# Patient Record
Sex: Female | Born: 1935 | Race: White | Hispanic: No | Marital: Married | Smoking: Never smoker
Health system: Southern US, Community
[De-identification: ages and names within clinical notes are randomized; demographics above are authoritative.]

## PROBLEM LIST (undated history)

## (undated) DIAGNOSIS — M545 Low back pain, unspecified: Secondary | ICD-10-CM

## (undated) DIAGNOSIS — B019 Varicella without complication: Secondary | ICD-10-CM

## (undated) DIAGNOSIS — G629 Polyneuropathy, unspecified: Secondary | ICD-10-CM

## (undated) DIAGNOSIS — F0390 Unspecified dementia without behavioral disturbance: Secondary | ICD-10-CM

## (undated) DIAGNOSIS — R42 Dizziness and giddiness: Secondary | ICD-10-CM

## (undated) DIAGNOSIS — H2513 Age-related nuclear cataract, bilateral: Secondary | ICD-10-CM

## (undated) DIAGNOSIS — M1990 Unspecified osteoarthritis, unspecified site: Secondary | ICD-10-CM

## (undated) DIAGNOSIS — H35033 Hypertensive retinopathy, bilateral: Secondary | ICD-10-CM

## (undated) DIAGNOSIS — N390 Urinary tract infection, site not specified: Secondary | ICD-10-CM

## (undated) DIAGNOSIS — R51 Headache: Secondary | ICD-10-CM

## (undated) DIAGNOSIS — E079 Disorder of thyroid, unspecified: Secondary | ICD-10-CM

## (undated) DIAGNOSIS — J449 Chronic obstructive pulmonary disease, unspecified: Secondary | ICD-10-CM

## (undated) DIAGNOSIS — I10 Essential (primary) hypertension: Secondary | ICD-10-CM

## (undated) DIAGNOSIS — E785 Hyperlipidemia, unspecified: Secondary | ICD-10-CM

## (undated) DIAGNOSIS — H3530 Unspecified macular degeneration: Secondary | ICD-10-CM

## (undated) DIAGNOSIS — T7840XA Allergy, unspecified, initial encounter: Secondary | ICD-10-CM

## (undated) DIAGNOSIS — E559 Vitamin D deficiency, unspecified: Secondary | ICD-10-CM

## (undated) DIAGNOSIS — R519 Headache, unspecified: Secondary | ICD-10-CM

## (undated) DIAGNOSIS — N289 Disorder of kidney and ureter, unspecified: Secondary | ICD-10-CM

## (undated) DIAGNOSIS — H3581 Retinal edema: Secondary | ICD-10-CM

## (undated) DIAGNOSIS — F329 Major depressive disorder, single episode, unspecified: Secondary | ICD-10-CM

## (undated) HISTORY — DX: Polyneuropathy, unspecified: G62.9

## (undated) HISTORY — DX: Major depressive disorder, single episode, unspecified: F32.9

## (undated) HISTORY — DX: Chronic obstructive pulmonary disease, unspecified: J44.9

## (undated) HISTORY — DX: Dizziness and giddiness: R42

## (undated) HISTORY — DX: Age-related nuclear cataract, bilateral: H25.13

## (undated) HISTORY — DX: Retinal edema: H35.81

## (undated) HISTORY — DX: Unspecified dementia, unspecified severity, without behavioral disturbance, psychotic disturbance, mood disturbance, and anxiety: F03.90

## (undated) HISTORY — DX: Vitamin D deficiency, unspecified: E55.9

## (undated) HISTORY — DX: Disorder of thyroid, unspecified: E07.9

## (undated) HISTORY — DX: Headache: R51

## (undated) HISTORY — DX: Allergy, unspecified, initial encounter: T78.40XA

## (undated) HISTORY — DX: Hyperlipidemia, unspecified: E78.5

## (undated) HISTORY — DX: Low back pain, unspecified: M54.50

## (undated) HISTORY — DX: Headache, unspecified: R51.9

## (undated) HISTORY — PX: ABDOMINAL HYSTERECTOMY: SHX81

## (undated) HISTORY — DX: Hypertensive retinopathy, bilateral: H35.033

## (undated) HISTORY — DX: Urinary tract infection, site not specified: N39.0

## (undated) HISTORY — DX: Unspecified osteoarthritis, unspecified site: M19.90

## (undated) HISTORY — DX: Unspecified macular degeneration: H35.30

## (undated) HISTORY — DX: Essential (primary) hypertension: I10

## (undated) HISTORY — DX: Low back pain: M54.5

## (undated) HISTORY — DX: Varicella without complication: B01.9

## (undated) HISTORY — PX: TRIGGER FINGER RELEASE: SHX641

## (undated) HISTORY — DX: Disorder of kidney and ureter, unspecified: N28.9

---

## 2010-01-12 HISTORY — PX: COLONOSCOPY: SHX174

## 2015-05-20 LAB — CBC AND DIFFERENTIAL
HEMATOCRIT: 40 (ref 36–46)
HEMOGLOBIN: 13.4 (ref 12.0–16.0)
PLATELETS: 263 (ref 150–399)
WBC: 7.3

## 2015-05-20 LAB — BASIC METABOLIC PANEL
BUN: 15 (ref 4–21)
Creatinine: 1 (ref 0.5–1.1)
Glucose: 143
Potassium: 3.6 (ref 3.4–5.3)
Sodium: 143 (ref 137–147)

## 2015-05-20 LAB — HEPATIC FUNCTION PANEL
ALK PHOS: 28 (ref 25–125)
ALT: 98 — AB (ref 7–35)
AST: 20 (ref 13–35)
BILIRUBIN, TOTAL: 3.3

## 2015-05-20 LAB — POCT INR: INR: 1 (ref 0.9–1.1)

## 2015-05-20 LAB — PROTIME-INR: Protime: 10.3 (ref 10.0–13.8)

## 2015-08-20 LAB — BASIC METABOLIC PANEL
BUN: 11 (ref 4–21)
CREATININE: 0.9 (ref 0.5–1.1)
Glucose: 125
POTASSIUM: 4.1 (ref 3.4–5.3)
Sodium: 143 (ref 137–147)

## 2015-08-20 LAB — LIPID PANEL
Cholesterol: 191 (ref 0–200)
HDL: 72 — AB (ref 35–70)
LDL Cholesterol: 93
TRIGLYCERIDES: 129 (ref 40–160)

## 2015-08-20 LAB — CBC AND DIFFERENTIAL
HCT: 44 (ref 36–46)
HEMOGLOBIN: 14.8 (ref 12.0–16.0)
Platelets: 284 (ref 150–399)
WBC: 6.3

## 2015-08-20 LAB — TSH: TSH: 2.77 (ref 0.41–5.90)

## 2016-01-13 HISTORY — PX: SACRAL NERVE STIMULATOR PLACEMENT: SHX2367

## 2016-02-05 DIAGNOSIS — E039 Hypothyroidism, unspecified: Secondary | ICD-10-CM | POA: Diagnosis not present

## 2016-02-05 DIAGNOSIS — F0390 Unspecified dementia without behavioral disturbance: Secondary | ICD-10-CM | POA: Diagnosis not present

## 2016-02-05 DIAGNOSIS — E784 Other hyperlipidemia: Secondary | ICD-10-CM | POA: Diagnosis not present

## 2016-02-05 DIAGNOSIS — M150 Primary generalized (osteo)arthritis: Secondary | ICD-10-CM | POA: Diagnosis not present

## 2016-02-05 DIAGNOSIS — I10 Essential (primary) hypertension: Secondary | ICD-10-CM | POA: Diagnosis not present

## 2016-02-05 DIAGNOSIS — E785 Hyperlipidemia, unspecified: Secondary | ICD-10-CM | POA: Diagnosis not present

## 2016-02-05 DIAGNOSIS — D510 Vitamin B12 deficiency anemia due to intrinsic factor deficiency: Secondary | ICD-10-CM | POA: Diagnosis not present

## 2016-02-29 DIAGNOSIS — J069 Acute upper respiratory infection, unspecified: Secondary | ICD-10-CM | POA: Diagnosis not present

## 2016-03-26 DIAGNOSIS — G629 Polyneuropathy, unspecified: Secondary | ICD-10-CM | POA: Diagnosis not present

## 2016-03-26 DIAGNOSIS — M1990 Unspecified osteoarthritis, unspecified site: Secondary | ICD-10-CM | POA: Diagnosis not present

## 2016-03-26 DIAGNOSIS — I10 Essential (primary) hypertension: Secondary | ICD-10-CM | POA: Diagnosis not present

## 2016-03-26 DIAGNOSIS — G9009 Other idiopathic peripheral autonomic neuropathy: Secondary | ICD-10-CM | POA: Diagnosis not present

## 2016-03-26 DIAGNOSIS — Z79899 Other long term (current) drug therapy: Secondary | ICD-10-CM | POA: Diagnosis not present

## 2016-03-26 DIAGNOSIS — F0390 Unspecified dementia without behavioral disturbance: Secondary | ICD-10-CM | POA: Diagnosis not present

## 2016-03-26 DIAGNOSIS — Z961 Presence of intraocular lens: Secondary | ICD-10-CM | POA: Diagnosis not present

## 2016-03-26 DIAGNOSIS — R42 Dizziness and giddiness: Secondary | ICD-10-CM | POA: Diagnosis not present

## 2016-03-26 DIAGNOSIS — Z90710 Acquired absence of both cervix and uterus: Secondary | ICD-10-CM | POA: Diagnosis not present

## 2016-03-26 DIAGNOSIS — Z9849 Cataract extraction status, unspecified eye: Secondary | ICD-10-CM | POA: Diagnosis not present

## 2016-03-26 DIAGNOSIS — Z8719 Personal history of other diseases of the digestive system: Secondary | ICD-10-CM | POA: Diagnosis not present

## 2016-03-26 DIAGNOSIS — M549 Dorsalgia, unspecified: Secondary | ICD-10-CM | POA: Diagnosis not present

## 2016-03-26 DIAGNOSIS — Z01818 Encounter for other preprocedural examination: Secondary | ICD-10-CM | POA: Diagnosis not present

## 2016-03-26 DIAGNOSIS — Z9889 Other specified postprocedural states: Secondary | ICD-10-CM | POA: Diagnosis not present

## 2016-03-26 DIAGNOSIS — Z7982 Long term (current) use of aspirin: Secondary | ICD-10-CM | POA: Diagnosis not present

## 2016-03-26 DIAGNOSIS — E785 Hyperlipidemia, unspecified: Secondary | ICD-10-CM | POA: Diagnosis not present

## 2016-04-02 DIAGNOSIS — G9009 Other idiopathic peripheral autonomic neuropathy: Secondary | ICD-10-CM | POA: Diagnosis not present

## 2016-04-02 DIAGNOSIS — G894 Chronic pain syndrome: Secondary | ICD-10-CM | POA: Diagnosis not present

## 2016-04-07 DIAGNOSIS — M546 Pain in thoracic spine: Secondary | ICD-10-CM | POA: Diagnosis not present

## 2016-04-07 DIAGNOSIS — M545 Low back pain: Secondary | ICD-10-CM | POA: Diagnosis not present

## 2016-04-07 DIAGNOSIS — M25512 Pain in left shoulder: Secondary | ICD-10-CM | POA: Diagnosis not present

## 2016-04-07 DIAGNOSIS — M79602 Pain in left arm: Secondary | ICD-10-CM | POA: Diagnosis not present

## 2016-04-07 DIAGNOSIS — M1990 Unspecified osteoarthritis, unspecified site: Secondary | ICD-10-CM | POA: Diagnosis not present

## 2016-04-07 DIAGNOSIS — M25561 Pain in right knee: Secondary | ICD-10-CM | POA: Diagnosis not present

## 2016-04-07 DIAGNOSIS — M79671 Pain in right foot: Secondary | ICD-10-CM | POA: Diagnosis not present

## 2016-04-07 DIAGNOSIS — M5030 Other cervical disc degeneration, unspecified cervical region: Secondary | ICD-10-CM | POA: Diagnosis not present

## 2016-04-07 DIAGNOSIS — F0390 Unspecified dementia without behavioral disturbance: Secondary | ICD-10-CM | POA: Diagnosis not present

## 2016-04-07 DIAGNOSIS — M79672 Pain in left foot: Secondary | ICD-10-CM | POA: Diagnosis not present

## 2016-04-07 DIAGNOSIS — M25562 Pain in left knee: Secondary | ICD-10-CM | POA: Diagnosis not present

## 2016-04-07 DIAGNOSIS — M4692 Unspecified inflammatory spondylopathy, cervical region: Secondary | ICD-10-CM | POA: Diagnosis not present

## 2016-04-07 DIAGNOSIS — M4802 Spinal stenosis, cervical region: Secondary | ICD-10-CM | POA: Diagnosis not present

## 2016-04-07 DIAGNOSIS — M5412 Radiculopathy, cervical region: Secondary | ICD-10-CM | POA: Diagnosis not present

## 2016-04-07 DIAGNOSIS — N289 Disorder of kidney and ureter, unspecified: Secondary | ICD-10-CM | POA: Diagnosis not present

## 2016-04-07 DIAGNOSIS — I10 Essential (primary) hypertension: Secondary | ICD-10-CM | POA: Diagnosis not present

## 2016-04-14 DIAGNOSIS — M7072 Other bursitis of hip, left hip: Secondary | ICD-10-CM | POA: Diagnosis not present

## 2016-04-14 DIAGNOSIS — M47812 Spondylosis without myelopathy or radiculopathy, cervical region: Secondary | ICD-10-CM | POA: Diagnosis not present

## 2016-04-14 DIAGNOSIS — I10 Essential (primary) hypertension: Secondary | ICD-10-CM | POA: Diagnosis not present

## 2016-04-14 DIAGNOSIS — F0390 Unspecified dementia without behavioral disturbance: Secondary | ICD-10-CM | POA: Diagnosis not present

## 2016-04-14 DIAGNOSIS — M5412 Radiculopathy, cervical region: Secondary | ICD-10-CM | POA: Diagnosis not present

## 2016-04-14 DIAGNOSIS — M79602 Pain in left arm: Secondary | ICD-10-CM | POA: Diagnosis not present

## 2016-04-14 DIAGNOSIS — M792 Neuralgia and neuritis, unspecified: Secondary | ICD-10-CM | POA: Diagnosis not present

## 2016-04-14 DIAGNOSIS — M25562 Pain in left knee: Secondary | ICD-10-CM | POA: Diagnosis not present

## 2016-04-14 DIAGNOSIS — M7071 Other bursitis of hip, right hip: Secondary | ICD-10-CM | POA: Diagnosis not present

## 2016-04-14 DIAGNOSIS — M79672 Pain in left foot: Secondary | ICD-10-CM | POA: Diagnosis not present

## 2016-04-14 DIAGNOSIS — Z969 Presence of functional implant, unspecified: Secondary | ICD-10-CM | POA: Diagnosis not present

## 2016-04-14 DIAGNOSIS — M79605 Pain in left leg: Secondary | ICD-10-CM | POA: Diagnosis not present

## 2016-04-14 DIAGNOSIS — M546 Pain in thoracic spine: Secondary | ICD-10-CM | POA: Diagnosis not present

## 2016-04-14 DIAGNOSIS — M4802 Spinal stenosis, cervical region: Secondary | ICD-10-CM | POA: Diagnosis not present

## 2016-04-14 DIAGNOSIS — M545 Low back pain: Secondary | ICD-10-CM | POA: Diagnosis not present

## 2016-04-14 DIAGNOSIS — N289 Disorder of kidney and ureter, unspecified: Secondary | ICD-10-CM | POA: Diagnosis not present

## 2016-04-14 DIAGNOSIS — M461 Sacroiliitis, not elsewhere classified: Secondary | ICD-10-CM | POA: Diagnosis not present

## 2016-04-14 DIAGNOSIS — M5030 Other cervical disc degeneration, unspecified cervical region: Secondary | ICD-10-CM | POA: Diagnosis not present

## 2016-04-14 DIAGNOSIS — M791 Myalgia: Secondary | ICD-10-CM | POA: Diagnosis not present

## 2016-04-14 DIAGNOSIS — M79604 Pain in right leg: Secondary | ICD-10-CM | POA: Diagnosis not present

## 2016-04-14 DIAGNOSIS — M4692 Unspecified inflammatory spondylopathy, cervical region: Secondary | ICD-10-CM | POA: Diagnosis not present

## 2016-04-14 DIAGNOSIS — M1990 Unspecified osteoarthritis, unspecified site: Secondary | ICD-10-CM | POA: Diagnosis not present

## 2016-04-14 DIAGNOSIS — M25512 Pain in left shoulder: Secondary | ICD-10-CM | POA: Diagnosis not present

## 2016-04-14 DIAGNOSIS — M25561 Pain in right knee: Secondary | ICD-10-CM | POA: Diagnosis not present

## 2016-04-14 DIAGNOSIS — M79671 Pain in right foot: Secondary | ICD-10-CM | POA: Diagnosis not present

## 2016-04-28 DIAGNOSIS — M4802 Spinal stenosis, cervical region: Secondary | ICD-10-CM | POA: Diagnosis not present

## 2016-04-28 DIAGNOSIS — G589 Mononeuropathy, unspecified: Secondary | ICD-10-CM | POA: Diagnosis not present

## 2016-04-28 DIAGNOSIS — M25562 Pain in left knee: Secondary | ICD-10-CM | POA: Diagnosis not present

## 2016-04-28 DIAGNOSIS — M79602 Pain in left arm: Secondary | ICD-10-CM | POA: Diagnosis not present

## 2016-04-28 DIAGNOSIS — M5412 Radiculopathy, cervical region: Secondary | ICD-10-CM | POA: Diagnosis not present

## 2016-04-28 DIAGNOSIS — M545 Low back pain: Secondary | ICD-10-CM | POA: Diagnosis not present

## 2016-04-28 DIAGNOSIS — M79672 Pain in left foot: Secondary | ICD-10-CM | POA: Diagnosis not present

## 2016-04-28 DIAGNOSIS — M79604 Pain in right leg: Secondary | ICD-10-CM | POA: Diagnosis not present

## 2016-04-28 DIAGNOSIS — M791 Myalgia: Secondary | ICD-10-CM | POA: Diagnosis not present

## 2016-04-28 DIAGNOSIS — M79671 Pain in right foot: Secondary | ICD-10-CM | POA: Diagnosis not present

## 2016-04-28 DIAGNOSIS — M461 Sacroiliitis, not elsewhere classified: Secondary | ICD-10-CM | POA: Diagnosis not present

## 2016-04-28 DIAGNOSIS — N289 Disorder of kidney and ureter, unspecified: Secondary | ICD-10-CM | POA: Diagnosis not present

## 2016-04-28 DIAGNOSIS — I10 Essential (primary) hypertension: Secondary | ICD-10-CM | POA: Diagnosis not present

## 2016-04-28 DIAGNOSIS — M25561 Pain in right knee: Secondary | ICD-10-CM | POA: Diagnosis not present

## 2016-04-28 DIAGNOSIS — M25512 Pain in left shoulder: Secondary | ICD-10-CM | POA: Diagnosis not present

## 2016-04-28 DIAGNOSIS — Z79899 Other long term (current) drug therapy: Secondary | ICD-10-CM | POA: Diagnosis not present

## 2016-04-28 DIAGNOSIS — F0390 Unspecified dementia without behavioral disturbance: Secondary | ICD-10-CM | POA: Diagnosis not present

## 2016-04-28 DIAGNOSIS — Z7982 Long term (current) use of aspirin: Secondary | ICD-10-CM | POA: Diagnosis not present

## 2016-04-28 DIAGNOSIS — M7072 Other bursitis of hip, left hip: Secondary | ICD-10-CM | POA: Diagnosis not present

## 2016-04-28 DIAGNOSIS — M5030 Other cervical disc degeneration, unspecified cervical region: Secondary | ICD-10-CM | POA: Diagnosis not present

## 2016-04-28 DIAGNOSIS — M7071 Other bursitis of hip, right hip: Secondary | ICD-10-CM | POA: Diagnosis not present

## 2016-04-28 DIAGNOSIS — M546 Pain in thoracic spine: Secondary | ICD-10-CM | POA: Diagnosis not present

## 2016-04-28 DIAGNOSIS — M542 Cervicalgia: Secondary | ICD-10-CM | POA: Diagnosis not present

## 2016-04-28 DIAGNOSIS — M79605 Pain in left leg: Secondary | ICD-10-CM | POA: Diagnosis not present

## 2016-04-28 DIAGNOSIS — M129 Arthropathy, unspecified: Secondary | ICD-10-CM | POA: Diagnosis not present

## 2016-04-28 DIAGNOSIS — M47812 Spondylosis without myelopathy or radiculopathy, cervical region: Secondary | ICD-10-CM | POA: Diagnosis not present

## 2016-04-30 DIAGNOSIS — G894 Chronic pain syndrome: Secondary | ICD-10-CM | POA: Diagnosis not present

## 2016-04-30 DIAGNOSIS — T8131XA Disruption of external operation (surgical) wound, not elsewhere classified, initial encounter: Secondary | ICD-10-CM | POA: Diagnosis not present

## 2016-04-30 DIAGNOSIS — G9009 Other idiopathic peripheral autonomic neuropathy: Secondary | ICD-10-CM | POA: Diagnosis not present

## 2016-04-30 DIAGNOSIS — Z969 Presence of functional implant, unspecified: Secondary | ICD-10-CM | POA: Diagnosis not present

## 2016-04-30 DIAGNOSIS — T819XXA Unspecified complication of procedure, initial encounter: Secondary | ICD-10-CM | POA: Diagnosis not present

## 2016-05-05 DIAGNOSIS — M545 Low back pain: Secondary | ICD-10-CM | POA: Diagnosis not present

## 2016-05-05 DIAGNOSIS — M79604 Pain in right leg: Secondary | ICD-10-CM | POA: Diagnosis not present

## 2016-05-05 DIAGNOSIS — Z79899 Other long term (current) drug therapy: Secondary | ICD-10-CM | POA: Diagnosis not present

## 2016-05-05 DIAGNOSIS — M150 Primary generalized (osteo)arthritis: Secondary | ICD-10-CM | POA: Diagnosis not present

## 2016-05-05 DIAGNOSIS — M7072 Other bursitis of hip, left hip: Secondary | ICD-10-CM | POA: Diagnosis not present

## 2016-05-05 DIAGNOSIS — M7071 Other bursitis of hip, right hip: Secondary | ICD-10-CM | POA: Diagnosis not present

## 2016-05-05 DIAGNOSIS — M791 Myalgia: Secondary | ICD-10-CM | POA: Diagnosis not present

## 2016-05-05 DIAGNOSIS — M79672 Pain in left foot: Secondary | ICD-10-CM | POA: Diagnosis not present

## 2016-05-05 DIAGNOSIS — G9009 Other idiopathic peripheral autonomic neuropathy: Secondary | ICD-10-CM | POA: Diagnosis not present

## 2016-05-05 DIAGNOSIS — M25562 Pain in left knee: Secondary | ICD-10-CM | POA: Diagnosis not present

## 2016-05-05 DIAGNOSIS — M79605 Pain in left leg: Secondary | ICD-10-CM | POA: Diagnosis not present

## 2016-05-05 DIAGNOSIS — Z7982 Long term (current) use of aspirin: Secondary | ICD-10-CM | POA: Diagnosis not present

## 2016-05-05 DIAGNOSIS — M5030 Other cervical disc degeneration, unspecified cervical region: Secondary | ICD-10-CM | POA: Diagnosis not present

## 2016-05-05 DIAGNOSIS — E784 Other hyperlipidemia: Secondary | ICD-10-CM | POA: Diagnosis not present

## 2016-05-05 DIAGNOSIS — M25561 Pain in right knee: Secondary | ICD-10-CM | POA: Diagnosis not present

## 2016-05-05 DIAGNOSIS — M129 Arthropathy, unspecified: Secondary | ICD-10-CM | POA: Diagnosis not present

## 2016-05-05 DIAGNOSIS — F0390 Unspecified dementia without behavioral disturbance: Secondary | ICD-10-CM | POA: Diagnosis not present

## 2016-05-05 DIAGNOSIS — M79671 Pain in right foot: Secondary | ICD-10-CM | POA: Diagnosis not present

## 2016-05-05 DIAGNOSIS — I10 Essential (primary) hypertension: Secondary | ICD-10-CM | POA: Diagnosis not present

## 2016-05-05 DIAGNOSIS — G309 Alzheimer's disease, unspecified: Secondary | ICD-10-CM | POA: Diagnosis not present

## 2016-05-05 DIAGNOSIS — M47892 Other spondylosis, cervical region: Secondary | ICD-10-CM | POA: Diagnosis not present

## 2016-05-05 DIAGNOSIS — I129 Hypertensive chronic kidney disease with stage 1 through stage 4 chronic kidney disease, or unspecified chronic kidney disease: Secondary | ICD-10-CM | POA: Diagnosis not present

## 2016-05-05 DIAGNOSIS — M546 Pain in thoracic spine: Secondary | ICD-10-CM | POA: Diagnosis not present

## 2016-05-05 DIAGNOSIS — M4802 Spinal stenosis, cervical region: Secondary | ICD-10-CM | POA: Diagnosis not present

## 2016-05-05 DIAGNOSIS — G589 Mononeuropathy, unspecified: Secondary | ICD-10-CM | POA: Diagnosis not present

## 2016-05-05 DIAGNOSIS — M542 Cervicalgia: Secondary | ICD-10-CM | POA: Diagnosis not present

## 2016-05-05 DIAGNOSIS — M461 Sacroiliitis, not elsewhere classified: Secondary | ICD-10-CM | POA: Diagnosis not present

## 2016-05-05 DIAGNOSIS — N189 Chronic kidney disease, unspecified: Secondary | ICD-10-CM | POA: Diagnosis not present

## 2016-05-05 DIAGNOSIS — M25512 Pain in left shoulder: Secondary | ICD-10-CM | POA: Diagnosis not present

## 2016-05-05 DIAGNOSIS — M79602 Pain in left arm: Secondary | ICD-10-CM | POA: Diagnosis not present

## 2016-05-06 DIAGNOSIS — H353132 Nonexudative age-related macular degeneration, bilateral, intermediate dry stage: Secondary | ICD-10-CM | POA: Diagnosis not present

## 2016-06-03 DIAGNOSIS — G309 Alzheimer's disease, unspecified: Secondary | ICD-10-CM | POA: Diagnosis not present

## 2016-06-03 DIAGNOSIS — M79672 Pain in left foot: Secondary | ICD-10-CM | POA: Diagnosis not present

## 2016-06-03 DIAGNOSIS — M79671 Pain in right foot: Secondary | ICD-10-CM | POA: Diagnosis not present

## 2016-06-03 DIAGNOSIS — I70213 Atherosclerosis of native arteries of extremities with intermittent claudication, bilateral legs: Secondary | ICD-10-CM | POA: Diagnosis not present

## 2016-06-03 DIAGNOSIS — B351 Tinea unguium: Secondary | ICD-10-CM | POA: Diagnosis not present

## 2016-06-16 DIAGNOSIS — I10 Essential (primary) hypertension: Secondary | ICD-10-CM | POA: Diagnosis not present

## 2016-06-16 DIAGNOSIS — M4802 Spinal stenosis, cervical region: Secondary | ICD-10-CM | POA: Diagnosis not present

## 2016-06-16 DIAGNOSIS — M546 Pain in thoracic spine: Secondary | ICD-10-CM | POA: Diagnosis not present

## 2016-06-16 DIAGNOSIS — M47812 Spondylosis without myelopathy or radiculopathy, cervical region: Secondary | ICD-10-CM | POA: Diagnosis not present

## 2016-06-16 DIAGNOSIS — M5412 Radiculopathy, cervical region: Secondary | ICD-10-CM | POA: Diagnosis not present

## 2016-06-16 DIAGNOSIS — M791 Myalgia: Secondary | ICD-10-CM | POA: Diagnosis not present

## 2016-06-16 DIAGNOSIS — M79602 Pain in left arm: Secondary | ICD-10-CM | POA: Diagnosis not present

## 2016-06-16 DIAGNOSIS — M1990 Unspecified osteoarthritis, unspecified site: Secondary | ICD-10-CM | POA: Diagnosis not present

## 2016-06-16 DIAGNOSIS — M25561 Pain in right knee: Secondary | ICD-10-CM | POA: Diagnosis not present

## 2016-06-16 DIAGNOSIS — M461 Sacroiliitis, not elsewhere classified: Secondary | ICD-10-CM | POA: Diagnosis not present

## 2016-06-16 DIAGNOSIS — M79671 Pain in right foot: Secondary | ICD-10-CM | POA: Diagnosis not present

## 2016-06-16 DIAGNOSIS — N289 Disorder of kidney and ureter, unspecified: Secondary | ICD-10-CM | POA: Diagnosis not present

## 2016-06-16 DIAGNOSIS — M545 Low back pain: Secondary | ICD-10-CM | POA: Diagnosis not present

## 2016-06-16 DIAGNOSIS — M25562 Pain in left knee: Secondary | ICD-10-CM | POA: Diagnosis not present

## 2016-06-16 DIAGNOSIS — M7071 Other bursitis of hip, right hip: Secondary | ICD-10-CM | POA: Diagnosis not present

## 2016-06-16 DIAGNOSIS — M4692 Unspecified inflammatory spondylopathy, cervical region: Secondary | ICD-10-CM | POA: Diagnosis not present

## 2016-06-16 DIAGNOSIS — M25512 Pain in left shoulder: Secondary | ICD-10-CM | POA: Diagnosis not present

## 2016-06-16 DIAGNOSIS — M79672 Pain in left foot: Secondary | ICD-10-CM | POA: Diagnosis not present

## 2016-06-16 DIAGNOSIS — M5030 Other cervical disc degeneration, unspecified cervical region: Secondary | ICD-10-CM | POA: Diagnosis not present

## 2016-06-16 DIAGNOSIS — M7072 Other bursitis of hip, left hip: Secondary | ICD-10-CM | POA: Diagnosis not present

## 2016-06-16 DIAGNOSIS — F0390 Unspecified dementia without behavioral disturbance: Secondary | ICD-10-CM | POA: Diagnosis not present

## 2016-08-04 DIAGNOSIS — G308 Other Alzheimer's disease: Secondary | ICD-10-CM | POA: Diagnosis not present

## 2016-08-04 DIAGNOSIS — E784 Other hyperlipidemia: Secondary | ICD-10-CM | POA: Diagnosis not present

## 2016-08-04 DIAGNOSIS — Z8673 Personal history of transient ischemic attack (TIA), and cerebral infarction without residual deficits: Secondary | ICD-10-CM | POA: Diagnosis not present

## 2016-08-04 DIAGNOSIS — I10 Essential (primary) hypertension: Secondary | ICD-10-CM | POA: Diagnosis not present

## 2016-08-12 DIAGNOSIS — E784 Other hyperlipidemia: Secondary | ICD-10-CM | POA: Diagnosis not present

## 2016-08-12 DIAGNOSIS — I10 Essential (primary) hypertension: Secondary | ICD-10-CM | POA: Diagnosis not present

## 2016-08-24 DIAGNOSIS — B351 Tinea unguium: Secondary | ICD-10-CM | POA: Diagnosis not present

## 2016-08-24 DIAGNOSIS — M79672 Pain in left foot: Secondary | ICD-10-CM | POA: Diagnosis not present

## 2016-08-24 DIAGNOSIS — M79671 Pain in right foot: Secondary | ICD-10-CM | POA: Diagnosis not present

## 2016-08-24 DIAGNOSIS — G309 Alzheimer's disease, unspecified: Secondary | ICD-10-CM | POA: Diagnosis not present

## 2016-08-24 DIAGNOSIS — I70213 Atherosclerosis of native arteries of extremities with intermittent claudication, bilateral legs: Secondary | ICD-10-CM | POA: Diagnosis not present

## 2016-10-01 DIAGNOSIS — M79672 Pain in left foot: Secondary | ICD-10-CM | POA: Diagnosis not present

## 2016-10-01 DIAGNOSIS — F0390 Unspecified dementia without behavioral disturbance: Secondary | ICD-10-CM | POA: Diagnosis not present

## 2016-10-01 DIAGNOSIS — M5030 Other cervical disc degeneration, unspecified cervical region: Secondary | ICD-10-CM | POA: Diagnosis not present

## 2016-10-01 DIAGNOSIS — Z969 Presence of functional implant, unspecified: Secondary | ICD-10-CM | POA: Diagnosis not present

## 2016-10-01 DIAGNOSIS — I10 Essential (primary) hypertension: Secondary | ICD-10-CM | POA: Diagnosis not present

## 2016-10-01 DIAGNOSIS — M79604 Pain in right leg: Secondary | ICD-10-CM | POA: Diagnosis not present

## 2016-10-01 DIAGNOSIS — N289 Disorder of kidney and ureter, unspecified: Secondary | ICD-10-CM | POA: Diagnosis not present

## 2016-10-01 DIAGNOSIS — M461 Sacroiliitis, not elsewhere classified: Secondary | ICD-10-CM | POA: Diagnosis not present

## 2016-10-01 DIAGNOSIS — M545 Low back pain: Secondary | ICD-10-CM | POA: Diagnosis not present

## 2016-10-01 DIAGNOSIS — M791 Myalgia: Secondary | ICD-10-CM | POA: Diagnosis not present

## 2016-10-01 DIAGNOSIS — M25561 Pain in right knee: Secondary | ICD-10-CM | POA: Diagnosis not present

## 2016-10-01 DIAGNOSIS — M25562 Pain in left knee: Secondary | ICD-10-CM | POA: Diagnosis not present

## 2016-10-01 DIAGNOSIS — M5412 Radiculopathy, cervical region: Secondary | ICD-10-CM | POA: Diagnosis not present

## 2016-10-01 DIAGNOSIS — M792 Neuralgia and neuritis, unspecified: Secondary | ICD-10-CM | POA: Diagnosis not present

## 2016-10-01 DIAGNOSIS — M47812 Spondylosis without myelopathy or radiculopathy, cervical region: Secondary | ICD-10-CM | POA: Diagnosis not present

## 2016-10-01 DIAGNOSIS — M4692 Unspecified inflammatory spondylopathy, cervical region: Secondary | ICD-10-CM | POA: Diagnosis not present

## 2016-10-01 DIAGNOSIS — M546 Pain in thoracic spine: Secondary | ICD-10-CM | POA: Diagnosis not present

## 2016-10-01 DIAGNOSIS — M79605 Pain in left leg: Secondary | ICD-10-CM | POA: Diagnosis not present

## 2016-10-01 DIAGNOSIS — M4802 Spinal stenosis, cervical region: Secondary | ICD-10-CM | POA: Diagnosis not present

## 2016-10-01 DIAGNOSIS — M7072 Other bursitis of hip, left hip: Secondary | ICD-10-CM | POA: Diagnosis not present

## 2016-10-01 DIAGNOSIS — M79671 Pain in right foot: Secondary | ICD-10-CM | POA: Diagnosis not present

## 2016-10-01 DIAGNOSIS — M7071 Other bursitis of hip, right hip: Secondary | ICD-10-CM | POA: Diagnosis not present

## 2016-10-27 DIAGNOSIS — M4802 Spinal stenosis, cervical region: Secondary | ICD-10-CM | POA: Diagnosis not present

## 2016-10-27 DIAGNOSIS — M25561 Pain in right knee: Secondary | ICD-10-CM | POA: Diagnosis not present

## 2016-10-27 DIAGNOSIS — F0390 Unspecified dementia without behavioral disturbance: Secondary | ICD-10-CM | POA: Diagnosis not present

## 2016-10-27 DIAGNOSIS — M25562 Pain in left knee: Secondary | ICD-10-CM | POA: Diagnosis not present

## 2016-10-27 DIAGNOSIS — M79672 Pain in left foot: Secondary | ICD-10-CM | POA: Diagnosis not present

## 2016-10-27 DIAGNOSIS — N289 Disorder of kidney and ureter, unspecified: Secondary | ICD-10-CM | POA: Diagnosis not present

## 2016-10-27 DIAGNOSIS — M542 Cervicalgia: Secondary | ICD-10-CM | POA: Diagnosis not present

## 2016-10-27 DIAGNOSIS — Z23 Encounter for immunization: Secondary | ICD-10-CM | POA: Diagnosis not present

## 2016-10-27 DIAGNOSIS — M79605 Pain in left leg: Secondary | ICD-10-CM | POA: Diagnosis not present

## 2016-10-27 DIAGNOSIS — M461 Sacroiliitis, not elsewhere classified: Secondary | ICD-10-CM | POA: Diagnosis not present

## 2016-10-27 DIAGNOSIS — M79671 Pain in right foot: Secondary | ICD-10-CM | POA: Diagnosis not present

## 2016-10-27 DIAGNOSIS — M25512 Pain in left shoulder: Secondary | ICD-10-CM | POA: Diagnosis not present

## 2016-10-27 DIAGNOSIS — G894 Chronic pain syndrome: Secondary | ICD-10-CM | POA: Diagnosis not present

## 2016-10-27 DIAGNOSIS — M79604 Pain in right leg: Secondary | ICD-10-CM | POA: Diagnosis not present

## 2016-10-27 DIAGNOSIS — M79602 Pain in left arm: Secondary | ICD-10-CM | POA: Diagnosis not present

## 2016-10-27 DIAGNOSIS — I10 Essential (primary) hypertension: Secondary | ICD-10-CM | POA: Diagnosis not present

## 2016-10-27 DIAGNOSIS — M546 Pain in thoracic spine: Secondary | ICD-10-CM | POA: Diagnosis not present

## 2016-10-27 DIAGNOSIS — M5412 Radiculopathy, cervical region: Secondary | ICD-10-CM | POA: Diagnosis not present

## 2016-10-27 DIAGNOSIS — Z969 Presence of functional implant, unspecified: Secondary | ICD-10-CM | POA: Diagnosis not present

## 2016-10-29 DIAGNOSIS — J019 Acute sinusitis, unspecified: Secondary | ICD-10-CM | POA: Diagnosis not present

## 2016-11-02 DIAGNOSIS — E7849 Other hyperlipidemia: Secondary | ICD-10-CM | POA: Diagnosis not present

## 2016-11-02 DIAGNOSIS — J029 Acute pharyngitis, unspecified: Secondary | ICD-10-CM | POA: Diagnosis not present

## 2016-11-02 DIAGNOSIS — G990 Autonomic neuropathy in diseases classified elsewhere: Secondary | ICD-10-CM | POA: Diagnosis not present

## 2016-11-02 DIAGNOSIS — M150 Primary generalized (osteo)arthritis: Secondary | ICD-10-CM | POA: Diagnosis not present

## 2016-11-02 DIAGNOSIS — E785 Hyperlipidemia, unspecified: Secondary | ICD-10-CM | POA: Diagnosis not present

## 2016-11-02 DIAGNOSIS — F0390 Unspecified dementia without behavioral disturbance: Secondary | ICD-10-CM | POA: Diagnosis not present

## 2016-11-02 DIAGNOSIS — R05 Cough: Secondary | ICD-10-CM | POA: Diagnosis not present

## 2016-11-02 DIAGNOSIS — I10 Essential (primary) hypertension: Secondary | ICD-10-CM | POA: Diagnosis not present

## 2016-11-09 DIAGNOSIS — Z1231 Encounter for screening mammogram for malignant neoplasm of breast: Secondary | ICD-10-CM | POA: Diagnosis not present

## 2016-11-10 DIAGNOSIS — M79672 Pain in left foot: Secondary | ICD-10-CM | POA: Diagnosis not present

## 2016-11-10 DIAGNOSIS — L600 Ingrowing nail: Secondary | ICD-10-CM | POA: Diagnosis not present

## 2016-11-10 DIAGNOSIS — I70213 Atherosclerosis of native arteries of extremities with intermittent claudication, bilateral legs: Secondary | ICD-10-CM | POA: Diagnosis not present

## 2016-11-10 DIAGNOSIS — M79671 Pain in right foot: Secondary | ICD-10-CM | POA: Diagnosis not present

## 2016-11-10 DIAGNOSIS — G309 Alzheimer's disease, unspecified: Secondary | ICD-10-CM | POA: Diagnosis not present

## 2016-11-10 DIAGNOSIS — B351 Tinea unguium: Secondary | ICD-10-CM | POA: Diagnosis not present

## 2016-11-30 DIAGNOSIS — Z1211 Encounter for screening for malignant neoplasm of colon: Secondary | ICD-10-CM | POA: Diagnosis not present

## 2016-12-24 DIAGNOSIS — M25512 Pain in left shoulder: Secondary | ICD-10-CM | POA: Diagnosis not present

## 2016-12-24 DIAGNOSIS — N289 Disorder of kidney and ureter, unspecified: Secondary | ICD-10-CM | POA: Diagnosis not present

## 2016-12-24 DIAGNOSIS — I10 Essential (primary) hypertension: Secondary | ICD-10-CM | POA: Diagnosis not present

## 2016-12-24 DIAGNOSIS — M546 Pain in thoracic spine: Secondary | ICD-10-CM | POA: Diagnosis not present

## 2016-12-24 DIAGNOSIS — M79672 Pain in left foot: Secondary | ICD-10-CM | POA: Diagnosis not present

## 2016-12-24 DIAGNOSIS — M1288 Other specific arthropathies, not elsewhere classified, other specified site: Secondary | ICD-10-CM | POA: Diagnosis not present

## 2016-12-24 DIAGNOSIS — M79605 Pain in left leg: Secondary | ICD-10-CM | POA: Diagnosis not present

## 2016-12-24 DIAGNOSIS — G629 Polyneuropathy, unspecified: Secondary | ICD-10-CM | POA: Diagnosis not present

## 2016-12-24 DIAGNOSIS — M7071 Other bursitis of hip, right hip: Secondary | ICD-10-CM | POA: Diagnosis not present

## 2016-12-24 DIAGNOSIS — M79671 Pain in right foot: Secondary | ICD-10-CM | POA: Diagnosis not present

## 2016-12-24 DIAGNOSIS — M791 Myalgia, unspecified site: Secondary | ICD-10-CM | POA: Diagnosis not present

## 2016-12-24 DIAGNOSIS — F0390 Unspecified dementia without behavioral disturbance: Secondary | ICD-10-CM | POA: Diagnosis not present

## 2016-12-24 DIAGNOSIS — M79602 Pain in left arm: Secondary | ICD-10-CM | POA: Diagnosis not present

## 2016-12-24 DIAGNOSIS — M461 Sacroiliitis, not elsewhere classified: Secondary | ICD-10-CM | POA: Diagnosis not present

## 2016-12-24 DIAGNOSIS — M5030 Other cervical disc degeneration, unspecified cervical region: Secondary | ICD-10-CM | POA: Diagnosis not present

## 2016-12-24 DIAGNOSIS — Z7982 Long term (current) use of aspirin: Secondary | ICD-10-CM | POA: Diagnosis not present

## 2016-12-24 DIAGNOSIS — M5412 Radiculopathy, cervical region: Secondary | ICD-10-CM | POA: Diagnosis not present

## 2016-12-24 DIAGNOSIS — M4802 Spinal stenosis, cervical region: Secondary | ICD-10-CM | POA: Diagnosis not present

## 2016-12-24 DIAGNOSIS — M545 Low back pain: Secondary | ICD-10-CM | POA: Diagnosis not present

## 2016-12-24 DIAGNOSIS — M79604 Pain in right leg: Secondary | ICD-10-CM | POA: Diagnosis not present

## 2016-12-24 DIAGNOSIS — M7072 Other bursitis of hip, left hip: Secondary | ICD-10-CM | POA: Diagnosis not present

## 2016-12-24 DIAGNOSIS — M170 Bilateral primary osteoarthritis of knee: Secondary | ICD-10-CM | POA: Diagnosis not present

## 2016-12-24 DIAGNOSIS — Z79899 Other long term (current) drug therapy: Secondary | ICD-10-CM | POA: Diagnosis not present

## 2016-12-28 DIAGNOSIS — Z01818 Encounter for other preprocedural examination: Secondary | ICD-10-CM | POA: Diagnosis not present

## 2016-12-28 DIAGNOSIS — I10 Essential (primary) hypertension: Secondary | ICD-10-CM | POA: Diagnosis not present

## 2016-12-31 DIAGNOSIS — Z1211 Encounter for screening for malignant neoplasm of colon: Secondary | ICD-10-CM | POA: Diagnosis not present

## 2016-12-31 DIAGNOSIS — F0390 Unspecified dementia without behavioral disturbance: Secondary | ICD-10-CM | POA: Diagnosis not present

## 2016-12-31 DIAGNOSIS — K648 Other hemorrhoids: Secondary | ICD-10-CM | POA: Diagnosis not present

## 2016-12-31 DIAGNOSIS — Z9981 Dependence on supplemental oxygen: Secondary | ICD-10-CM | POA: Diagnosis not present

## 2016-12-31 DIAGNOSIS — Z90710 Acquired absence of both cervix and uterus: Secondary | ICD-10-CM | POA: Diagnosis not present

## 2016-12-31 DIAGNOSIS — K5730 Diverticulosis of large intestine without perforation or abscess without bleeding: Secondary | ICD-10-CM | POA: Diagnosis not present

## 2016-12-31 DIAGNOSIS — M1990 Unspecified osteoarthritis, unspecified site: Secondary | ICD-10-CM | POA: Diagnosis not present

## 2016-12-31 DIAGNOSIS — I10 Essential (primary) hypertension: Secondary | ICD-10-CM | POA: Diagnosis not present

## 2016-12-31 DIAGNOSIS — K5790 Diverticulosis of intestine, part unspecified, without perforation or abscess without bleeding: Secondary | ICD-10-CM | POA: Diagnosis not present

## 2017-01-19 DIAGNOSIS — G309 Alzheimer's disease, unspecified: Secondary | ICD-10-CM | POA: Diagnosis not present

## 2017-01-19 DIAGNOSIS — I70213 Atherosclerosis of native arteries of extremities with intermittent claudication, bilateral legs: Secondary | ICD-10-CM | POA: Diagnosis not present

## 2017-01-19 DIAGNOSIS — M79672 Pain in left foot: Secondary | ICD-10-CM | POA: Diagnosis not present

## 2017-01-19 DIAGNOSIS — B351 Tinea unguium: Secondary | ICD-10-CM | POA: Diagnosis not present

## 2017-01-19 DIAGNOSIS — M79671 Pain in right foot: Secondary | ICD-10-CM | POA: Diagnosis not present

## 2017-01-20 DIAGNOSIS — M170 Bilateral primary osteoarthritis of knee: Secondary | ICD-10-CM | POA: Diagnosis not present

## 2017-01-20 DIAGNOSIS — K5730 Diverticulosis of large intestine without perforation or abscess without bleeding: Secondary | ICD-10-CM | POA: Diagnosis not present

## 2017-02-01 DIAGNOSIS — E785 Hyperlipidemia, unspecified: Secondary | ICD-10-CM | POA: Diagnosis not present

## 2017-02-01 DIAGNOSIS — M150 Primary generalized (osteo)arthritis: Secondary | ICD-10-CM | POA: Diagnosis not present

## 2017-02-01 DIAGNOSIS — F320 Major depressive disorder, single episode, mild: Secondary | ICD-10-CM | POA: Diagnosis not present

## 2017-02-01 DIAGNOSIS — I10 Essential (primary) hypertension: Secondary | ICD-10-CM | POA: Diagnosis not present

## 2017-02-01 DIAGNOSIS — G309 Alzheimer's disease, unspecified: Secondary | ICD-10-CM | POA: Diagnosis not present

## 2017-02-04 DIAGNOSIS — M79672 Pain in left foot: Secondary | ICD-10-CM | POA: Diagnosis not present

## 2017-02-04 DIAGNOSIS — M79604 Pain in right leg: Secondary | ICD-10-CM | POA: Diagnosis not present

## 2017-02-04 DIAGNOSIS — M170 Bilateral primary osteoarthritis of knee: Secondary | ICD-10-CM | POA: Diagnosis not present

## 2017-02-04 DIAGNOSIS — M461 Sacroiliitis, not elsewhere classified: Secondary | ICD-10-CM | POA: Diagnosis not present

## 2017-02-04 DIAGNOSIS — N289 Disorder of kidney and ureter, unspecified: Secondary | ICD-10-CM | POA: Diagnosis not present

## 2017-02-04 DIAGNOSIS — M542 Cervicalgia: Secondary | ICD-10-CM | POA: Diagnosis not present

## 2017-02-04 DIAGNOSIS — G629 Polyneuropathy, unspecified: Secondary | ICD-10-CM | POA: Diagnosis not present

## 2017-02-04 DIAGNOSIS — M79671 Pain in right foot: Secondary | ICD-10-CM | POA: Diagnosis not present

## 2017-02-04 DIAGNOSIS — I10 Essential (primary) hypertension: Secondary | ICD-10-CM | POA: Diagnosis not present

## 2017-02-04 DIAGNOSIS — F0390 Unspecified dementia without behavioral disturbance: Secondary | ICD-10-CM | POA: Diagnosis not present

## 2017-02-04 DIAGNOSIS — M25512 Pain in left shoulder: Secondary | ICD-10-CM | POA: Diagnosis not present

## 2017-02-04 DIAGNOSIS — Z79899 Other long term (current) drug therapy: Secondary | ICD-10-CM | POA: Diagnosis not present

## 2017-02-04 DIAGNOSIS — M4802 Spinal stenosis, cervical region: Secondary | ICD-10-CM | POA: Diagnosis not present

## 2017-02-04 DIAGNOSIS — G959 Disease of spinal cord, unspecified: Secondary | ICD-10-CM | POA: Diagnosis not present

## 2017-02-04 DIAGNOSIS — M545 Low back pain: Secondary | ICD-10-CM | POA: Diagnosis not present

## 2017-02-04 DIAGNOSIS — M7071 Other bursitis of hip, right hip: Secondary | ICD-10-CM | POA: Diagnosis not present

## 2017-02-04 DIAGNOSIS — M7072 Other bursitis of hip, left hip: Secondary | ICD-10-CM | POA: Diagnosis not present

## 2017-02-04 DIAGNOSIS — M791 Myalgia, unspecified site: Secondary | ICD-10-CM | POA: Diagnosis not present

## 2017-02-04 DIAGNOSIS — M79602 Pain in left arm: Secondary | ICD-10-CM | POA: Diagnosis not present

## 2017-02-04 DIAGNOSIS — M79605 Pain in left leg: Secondary | ICD-10-CM | POA: Diagnosis not present

## 2017-02-04 DIAGNOSIS — M7918 Myalgia, other site: Secondary | ICD-10-CM | POA: Diagnosis not present

## 2017-02-04 DIAGNOSIS — M546 Pain in thoracic spine: Secondary | ICD-10-CM | POA: Diagnosis not present

## 2017-02-04 DIAGNOSIS — Z7982 Long term (current) use of aspirin: Secondary | ICD-10-CM | POA: Diagnosis not present

## 2017-04-19 DIAGNOSIS — M1711 Unilateral primary osteoarthritis, right knee: Secondary | ICD-10-CM | POA: Diagnosis not present

## 2017-04-19 DIAGNOSIS — F0390 Unspecified dementia without behavioral disturbance: Secondary | ICD-10-CM | POA: Diagnosis not present

## 2017-04-19 DIAGNOSIS — M170 Bilateral primary osteoarthritis of knee: Secondary | ICD-10-CM | POA: Diagnosis not present

## 2017-04-19 DIAGNOSIS — M545 Low back pain: Secondary | ICD-10-CM | POA: Diagnosis not present

## 2017-04-19 DIAGNOSIS — G629 Polyneuropathy, unspecified: Secondary | ICD-10-CM | POA: Diagnosis not present

## 2017-06-03 DIAGNOSIS — I10 Essential (primary) hypertension: Secondary | ICD-10-CM | POA: Diagnosis not present

## 2017-06-03 DIAGNOSIS — F325 Major depressive disorder, single episode, in full remission: Secondary | ICD-10-CM | POA: Diagnosis not present

## 2017-06-03 DIAGNOSIS — R42 Dizziness and giddiness: Secondary | ICD-10-CM | POA: Diagnosis not present

## 2017-06-03 DIAGNOSIS — M545 Low back pain: Secondary | ICD-10-CM | POA: Diagnosis not present

## 2017-06-03 DIAGNOSIS — E785 Hyperlipidemia, unspecified: Secondary | ICD-10-CM | POA: Diagnosis not present

## 2017-06-03 DIAGNOSIS — F0390 Unspecified dementia without behavioral disturbance: Secondary | ICD-10-CM | POA: Diagnosis not present

## 2017-06-03 DIAGNOSIS — M150 Primary generalized (osteo)arthritis: Secondary | ICD-10-CM | POA: Diagnosis not present

## 2017-06-03 LAB — LIPID PANEL
Cholesterol: 169 (ref 0–200)
HDL: 66 (ref 35–70)
LDL Cholesterol: 87
Triglycerides: 82 (ref 40–160)

## 2017-06-03 LAB — BASIC METABOLIC PANEL
BUN: 14 (ref 4–21)
Creatinine: 0.9 (ref 0.5–1.1)
Glucose: 111
Potassium: 4 (ref 3.4–5.3)
Sodium: 143 (ref 137–147)

## 2017-06-03 LAB — HEPATIC FUNCTION PANEL
ALT: 31 (ref 7–35)
AST: 25 (ref 13–35)
Alkaline Phosphatase: 113 (ref 25–125)
Bilirubin, Direct: 0.1 (ref 0.01–0.4)
Bilirubin, Total: 0.6

## 2017-06-03 LAB — CBC AND DIFFERENTIAL
HCT: 43 (ref 36–46)
Hemoglobin: 14.1 (ref 12.0–16.0)
Platelets: 250 (ref 150–399)
WBC: 7

## 2017-06-04 DIAGNOSIS — Z23 Encounter for immunization: Secondary | ICD-10-CM | POA: Diagnosis not present

## 2017-07-30 DIAGNOSIS — I10 Essential (primary) hypertension: Secondary | ICD-10-CM | POA: Diagnosis not present

## 2017-07-30 DIAGNOSIS — E785 Hyperlipidemia, unspecified: Secondary | ICD-10-CM | POA: Diagnosis not present

## 2017-07-30 LAB — BASIC METABOLIC PANEL
BUN: 13 (ref 4–21)
Creatinine: 0.9 (ref 0.5–1.1)
Glucose: 101
Potassium: 4 (ref 3.4–5.3)
Sodium: 144 (ref 137–147)

## 2017-07-30 LAB — LIPID PANEL
Cholesterol: 168 (ref 0–200)
HDL: 65 (ref 35–70)
LDL Cholesterol: 84
Triglycerides: 97 (ref 40–160)

## 2017-08-24 DIAGNOSIS — M7071 Other bursitis of hip, right hip: Secondary | ICD-10-CM | POA: Diagnosis not present

## 2017-08-24 DIAGNOSIS — N289 Disorder of kidney and ureter, unspecified: Secondary | ICD-10-CM | POA: Diagnosis not present

## 2017-08-24 DIAGNOSIS — M170 Bilateral primary osteoarthritis of knee: Secondary | ICD-10-CM | POA: Diagnosis not present

## 2017-08-24 DIAGNOSIS — I10 Essential (primary) hypertension: Secondary | ICD-10-CM | POA: Diagnosis not present

## 2017-08-24 DIAGNOSIS — M461 Sacroiliitis, not elsewhere classified: Secondary | ICD-10-CM | POA: Diagnosis not present

## 2017-08-24 DIAGNOSIS — M4802 Spinal stenosis, cervical region: Secondary | ICD-10-CM | POA: Diagnosis not present

## 2017-08-24 DIAGNOSIS — Z9689 Presence of other specified functional implants: Secondary | ICD-10-CM | POA: Diagnosis not present

## 2017-08-24 DIAGNOSIS — M79671 Pain in right foot: Secondary | ICD-10-CM | POA: Diagnosis not present

## 2017-08-24 DIAGNOSIS — F0390 Unspecified dementia without behavioral disturbance: Secondary | ICD-10-CM | POA: Diagnosis not present

## 2017-08-24 DIAGNOSIS — M792 Neuralgia and neuritis, unspecified: Secondary | ICD-10-CM | POA: Diagnosis not present

## 2017-08-24 DIAGNOSIS — M7918 Myalgia, other site: Secondary | ICD-10-CM | POA: Diagnosis not present

## 2017-08-24 DIAGNOSIS — M7072 Other bursitis of hip, left hip: Secondary | ICD-10-CM | POA: Diagnosis not present

## 2017-08-24 DIAGNOSIS — M25512 Pain in left shoulder: Secondary | ICD-10-CM | POA: Diagnosis not present

## 2017-08-24 DIAGNOSIS — Z90710 Acquired absence of both cervix and uterus: Secondary | ICD-10-CM | POA: Diagnosis not present

## 2017-08-24 DIAGNOSIS — M546 Pain in thoracic spine: Secondary | ICD-10-CM | POA: Diagnosis not present

## 2017-08-24 DIAGNOSIS — M79602 Pain in left arm: Secondary | ICD-10-CM | POA: Diagnosis not present

## 2017-08-24 DIAGNOSIS — M545 Low back pain: Secondary | ICD-10-CM | POA: Diagnosis not present

## 2017-08-24 DIAGNOSIS — M5010 Cervical disc disorder with radiculopathy, unspecified cervical region: Secondary | ICD-10-CM | POA: Diagnosis not present

## 2017-08-24 DIAGNOSIS — M79605 Pain in left leg: Secondary | ICD-10-CM | POA: Diagnosis not present

## 2017-08-24 DIAGNOSIS — M4692 Unspecified inflammatory spondylopathy, cervical region: Secondary | ICD-10-CM | POA: Diagnosis not present

## 2017-08-24 DIAGNOSIS — M79672 Pain in left foot: Secondary | ICD-10-CM | POA: Diagnosis not present

## 2017-08-24 DIAGNOSIS — M79604 Pain in right leg: Secondary | ICD-10-CM | POA: Diagnosis not present

## 2017-09-01 DIAGNOSIS — E785 Hyperlipidemia, unspecified: Secondary | ICD-10-CM | POA: Diagnosis not present

## 2017-09-01 DIAGNOSIS — R42 Dizziness and giddiness: Secondary | ICD-10-CM | POA: Diagnosis not present

## 2017-09-01 DIAGNOSIS — M150 Primary generalized (osteo)arthritis: Secondary | ICD-10-CM | POA: Diagnosis not present

## 2017-09-01 DIAGNOSIS — F325 Major depressive disorder, single episode, in full remission: Secondary | ICD-10-CM | POA: Diagnosis not present

## 2017-09-01 DIAGNOSIS — F0280 Dementia in other diseases classified elsewhere without behavioral disturbance: Secondary | ICD-10-CM | POA: Diagnosis not present

## 2017-09-01 DIAGNOSIS — M545 Low back pain: Secondary | ICD-10-CM | POA: Diagnosis not present

## 2017-09-01 DIAGNOSIS — I10 Essential (primary) hypertension: Secondary | ICD-10-CM | POA: Diagnosis not present

## 2017-09-08 DIAGNOSIS — E785 Hyperlipidemia, unspecified: Secondary | ICD-10-CM | POA: Diagnosis not present

## 2017-09-08 DIAGNOSIS — R51 Headache: Secondary | ICD-10-CM | POA: Diagnosis not present

## 2017-09-08 DIAGNOSIS — I10 Essential (primary) hypertension: Secondary | ICD-10-CM | POA: Diagnosis not present

## 2017-09-08 DIAGNOSIS — M1990 Unspecified osteoarthritis, unspecified site: Secondary | ICD-10-CM | POA: Diagnosis not present

## 2017-09-08 DIAGNOSIS — F0390 Unspecified dementia without behavioral disturbance: Secondary | ICD-10-CM | POA: Diagnosis not present

## 2017-09-08 DIAGNOSIS — Z791 Long term (current) use of non-steroidal anti-inflammatories (NSAID): Secondary | ICD-10-CM | POA: Diagnosis not present

## 2017-09-08 DIAGNOSIS — Z79899 Other long term (current) drug therapy: Secondary | ICD-10-CM | POA: Diagnosis not present

## 2017-09-08 DIAGNOSIS — G629 Polyneuropathy, unspecified: Secondary | ICD-10-CM | POA: Diagnosis not present

## 2017-09-08 DIAGNOSIS — Z90710 Acquired absence of both cervix and uterus: Secondary | ICD-10-CM | POA: Diagnosis not present

## 2017-09-09 DIAGNOSIS — I6602 Occlusion and stenosis of left middle cerebral artery: Secondary | ICD-10-CM | POA: Diagnosis not present

## 2017-09-09 DIAGNOSIS — G629 Polyneuropathy, unspecified: Secondary | ICD-10-CM | POA: Diagnosis not present

## 2017-09-09 DIAGNOSIS — E042 Nontoxic multinodular goiter: Secondary | ICD-10-CM | POA: Diagnosis not present

## 2017-09-09 DIAGNOSIS — R51 Headache: Secondary | ICD-10-CM | POA: Diagnosis not present

## 2017-09-09 DIAGNOSIS — I10 Essential (primary) hypertension: Secondary | ICD-10-CM | POA: Diagnosis not present

## 2017-09-09 DIAGNOSIS — E785 Hyperlipidemia, unspecified: Secondary | ICD-10-CM | POA: Diagnosis not present

## 2017-09-09 DIAGNOSIS — G319 Degenerative disease of nervous system, unspecified: Secondary | ICD-10-CM | POA: Diagnosis not present

## 2017-09-09 DIAGNOSIS — F0390 Unspecified dementia without behavioral disturbance: Secondary | ICD-10-CM | POA: Diagnosis not present

## 2017-09-10 DIAGNOSIS — H353132 Nonexudative age-related macular degeneration, bilateral, intermediate dry stage: Secondary | ICD-10-CM | POA: Diagnosis not present

## 2017-09-10 DIAGNOSIS — Z961 Presence of intraocular lens: Secondary | ICD-10-CM | POA: Diagnosis not present

## 2017-09-21 NOTE — Progress Notes (Signed)
[name] [address] [place], [place] [address] Phone: [phone] Subjective:     CC: Bilateral knee pain  NGE:[id number]  Chelsea Maddox is a 82 y.o. female coming in with complaint of bilateral knee pain. Chronic pain. Has had steroid and visco injections. Last steroid injection was in April 2019. Pain in the entire joint. Has neuropathy in legs and feet. Has nerve stimulator in her back.  Patient continues to have back pain.  Has seen multiple providers for it previously.  Has had injections.  Moving to town to medicine for referral for someone to continue these injections.  Will be screening in patient's history but has a nerve stimulator in the back already and has had repetitive facet and epidural injections in the back over the course the last 2 years.  States that she has chronic pain in the back.  Feels that the injections can be helpful.    Past Medical History:  Diagnosis Date  . Allergy   . Arthritis   . Chicken pox   . Generalized headaches   . Hypertension   . Thyroid disease   . UTI (urinary tract infection)     Social History   Socioeconomic History  . Marital status: Married    Spouse name: Not on file  . Number of children: Not on file  . Years of education: Not on file  . Highest education level: Not on file  Occupational History  . Not on file  Social Needs  . Financial resource strain: Not on file  . Food insecurity:    Worry: Not on file    Inability: Not on file  . Transportation needs:    Medical: Not on file    Non-medical: Not on file  Tobacco Use  . Smoking status: Not on file  Substance and Sexual Activity  . Alcohol use: Not on file  . Drug use: Not on file  . Sexual activity: Not on file  Lifestyle  . Physical activity:    Days per week: Not on file    Minutes per session: Not on file  . Stress: Not on file  Relationships  . Social connections:    Talks on phone: Not on file    Gets together: Not  on file    Attends religious service: Not on file    Active member of club or organization: Not on file    Attends meetings of clubs or organizations: Not on file    Relationship status: Not on file  Other Topics Concern  . Not on file  Social History Narrative  . Not on file   Not on File Family History  Problem Relation Age of Onset  . Arthritis Mother   . Diabetes Mother   . Hearing loss Mother   . Hyperlipidemia Mother   . Hypertension Mother   . Arthritis Father   . COPD Father   . Hearing loss Father   . Heart disease Father   . Hyperlipidemia Father   . Hypertension Father   . Kidney disease Father   . Arthritis Sister   . Hypertension Sister   . Arthritis Sister   . Hearing loss Sister   . Hypertension Sister   . Early death Brother   . Hyperlipidemia Brother   . Hypertension Brother   . Arthritis Brother   . Hypertension Brother     Family history is consistent for different types of osteoarthritis as well as a sister with  rheumatoid arthritis   Current Outpatient Medications (Cardiovascular):  .  losartan-hydrochlorothiazide (HYZAAR) 100-12.5 MG tablet, Take 1 tablet by mouth daily. .  simvastatin (ZOCOR) 20 MG tablet, Take 20 mg by mouth daily.  Current Outpatient Medications (Respiratory):  .  benzonatate (TESSALON) 100 MG capsule, Take by mouth 3 (three) times daily as needed for cough.  Current Outpatient Medications (Analgesics):  .  meloxicam (MOBIC) 15 MG tablet, Take 15 mg by mouth daily.   Current Outpatient Medications (Other):  .  donepezil (ARICEPT) 10 MG tablet, Take 10 mg by mouth at bedtime. Marland Kitchen  escitalopram (LEXAPRO) 10 MG tablet, Take 10 mg by mouth daily. .  meclizine (ANTIVERT) 12.5 MG tablet, Take 12.5 mg by mouth 2 (two) times daily.  .  memantine (NAMENDA) 10 MG tablet, Take 10 mg by mouth daily.     Past medical history, social, surgical and family history all reviewed in electronic medical record.  No pertanent information  unless stated regarding to the chief complaint.   Review of Systems:  No headache, visual changes, nausea, vomiting, diarrhea, constipation, dizziness, abdominal pain, skin rash, fevers, chills, night sweats, weight loss, swollen lymph nodes,, chest pain, shortness of breath, mood changes.  Positive muscle aches, joint swelling, body aches  Objective  Blood pressure 140/86, pulse 64, height 5\' 5"  (1.651 m), weight 175 lb (79.4 kg), SpO2 97 %. Systems examined below as of    General: No apparent distress alert but not oriented mood and affect normal, dressed appropriately.  HEENT: Pupils equal, extraocular movements intact  Respiratory: Patient's speak in full sentences and does not appear short of breath  Cardiovascular: Trace lower extremity edema, non tender, no erythema  Skin: Warm dry intact with no signs of infection or rash on extremities or on axial skeleton.  Abdomen: Soft nontender  Neuro: Cranial nerves II through XII are intact, neurovascularly intact in all extremities with 2+ DTRs and 2+ pulses.  Lymph: No lymphadenopathy of posterior or anterior cervical chain or axillae bilaterally.  Gait antalgic gait MSK:  tender with mild limited range of motion but good stability and symmetric strength and tone of shoulders, elbows, wrist, hip and ankles bilaterally.  Arthritic changes of the hands noted.  Knee: Bilateral valgus deformity noted.  Normal thigh to calf ratio.  Tender to palpation over medial and PF joint line.  Mild limited range of motion 5 degrees in extension and flexion instability with valgus force.  painful patellar compression. Patellar glide with moderate crepitus. Patellar and quadriceps tendons unremarkable. Hamstring and quadriceps strength is normal.   Back exam shows some degenerative scoliosis and tenderness diffusely in the paraspinal musculature of the lumbar spine.  Significant tightness with Pearlean Brownie test.  Tight hamstrings but negative radicular  symptoms.  Stimulator palpated no erythema.  After informed written and verbal consent, patient was seated on exam table. Right knee was prepped with alcohol swab and utilizing anterolateral approach, patient's right knee space was injected with 4:1  marcaine 0.5%: Kenalog 40mg /dL. Patient tolerated the procedure well without immediate complications.  After informed written and verbal consent, patient was seated on exam table. Left knee was prepped with alcohol swab and utilizing anterolateral approach, patient's left knee space was injected with 4:1  marcaine 0.5%: Kenalog 40mg /dL. Patient tolerated the procedure well without immediate complications.    Impression and Recommendations:     This case required medical decision making of moderate complexity. The above documentation has been reviewed and is accurate and complete Judi Saa, DO  Note: This dictation was prepared with Dragon dictation along with smaller phrase technology. Any transcriptional errors that result from this process are unintentional.

## 2017-09-22 ENCOUNTER — Ambulatory Visit: Payer: Self-pay

## 2017-09-22 ENCOUNTER — Encounter: Payer: Self-pay | Admitting: Family Medicine

## 2017-09-22 ENCOUNTER — Other Ambulatory Visit (INDEPENDENT_AMBULATORY_CARE_PROVIDER_SITE_OTHER): Payer: Medicare Other

## 2017-09-22 ENCOUNTER — Encounter

## 2017-09-22 ENCOUNTER — Ambulatory Visit (INDEPENDENT_AMBULATORY_CARE_PROVIDER_SITE_OTHER): Payer: Medicare Other | Admitting: Family Medicine

## 2017-09-22 VITALS — BP 140/86 | HR 64 | Ht 65.0 in | Wt 175.0 lb

## 2017-09-22 DIAGNOSIS — R7989 Other specified abnormal findings of blood chemistry: Secondary | ICD-10-CM

## 2017-09-22 DIAGNOSIS — M2550 Pain in unspecified joint: Secondary | ICD-10-CM | POA: Diagnosis not present

## 2017-09-22 DIAGNOSIS — M545 Low back pain, unspecified: Secondary | ICD-10-CM | POA: Insufficient documentation

## 2017-09-22 DIAGNOSIS — F0391 Unspecified dementia with behavioral disturbance: Secondary | ICD-10-CM | POA: Diagnosis not present

## 2017-09-22 DIAGNOSIS — M25562 Pain in left knee: Secondary | ICD-10-CM

## 2017-09-22 DIAGNOSIS — E559 Vitamin D deficiency, unspecified: Secondary | ICD-10-CM

## 2017-09-22 DIAGNOSIS — G8929 Other chronic pain: Secondary | ICD-10-CM

## 2017-09-22 DIAGNOSIS — M25561 Pain in right knee: Secondary | ICD-10-CM

## 2017-09-22 DIAGNOSIS — M170 Bilateral primary osteoarthritis of knee: Secondary | ICD-10-CM | POA: Diagnosis not present

## 2017-09-22 DIAGNOSIS — E538 Deficiency of other specified B group vitamins: Secondary | ICD-10-CM | POA: Diagnosis not present

## 2017-09-22 DIAGNOSIS — G894 Chronic pain syndrome: Secondary | ICD-10-CM | POA: Diagnosis not present

## 2017-09-22 DIAGNOSIS — E038 Other specified hypothyroidism: Secondary | ICD-10-CM | POA: Diagnosis not present

## 2017-09-22 DIAGNOSIS — F0390 Unspecified dementia without behavioral disturbance: Secondary | ICD-10-CM | POA: Insufficient documentation

## 2017-09-22 LAB — COMPREHENSIVE METABOLIC PANEL
ALK PHOS: 84 U/L (ref 39–117)
ALT: 17 U/L (ref 0–35)
AST: 17 U/L (ref 0–37)
Albumin: 4.3 g/dL (ref 3.5–5.2)
BUN: 31 mg/dL — ABNORMAL HIGH (ref 6–23)
CALCIUM: 9.6 mg/dL (ref 8.4–10.5)
CO2: 29 meq/L (ref 19–32)
Chloride: 105 mEq/L (ref 96–112)
Creatinine, Ser: 1.09 mg/dL (ref 0.40–1.20)
GFR: 51.33 mL/min — AB (ref >60.00)
GLUCOSE: 91 mg/dL (ref 70–99)
POTASSIUM: 4 meq/L (ref 3.5–5.1)
Sodium: 143 mEq/L (ref 135–145)
Total Bilirubin: 0.6 mg/dL (ref 0.2–1.2)
Total Protein: 7.6 g/dL (ref 6.0–8.3)

## 2017-09-22 LAB — CBC WITH DIFFERENTIAL/PLATELET
Basophils Absolute: 0.1 10*3/uL (ref 0.0–0.1)
Basophils Relative: 0.9 % (ref 0.0–3.0)
Eosinophils Absolute: 0.1 10*3/uL (ref 0.0–0.7)
Eosinophils Relative: 1.9 % (ref 0.0–5.0)
HEMATOCRIT: 41.6 % (ref 36.0–46.0)
Hemoglobin: 13.9 g/dL (ref 12.0–15.0)
LYMPHS ABS: 1.4 10*3/uL (ref 0.7–4.0)
LYMPHS PCT: 22.4 % (ref 12.0–46.0)
MCHC: 33.3 g/dL (ref 30.0–36.0)
MCV: 88 fl (ref 78.0–100.0)
MONOS PCT: 9.5 % (ref 3.0–12.0)
Monocytes Absolute: 0.6 10*3/uL (ref 0.1–1.0)
NEUTROS ABS: 4.1 10*3/uL (ref 1.4–7.7)
NEUTROS PCT: 65.3 % (ref 43.0–77.0)
Platelets: 246 10*3/uL (ref 150.0–400.0)
RBC: 4.72 Mil/uL (ref 3.87–5.11)
RDW: 14.1 % (ref 11.5–15.5)
WBC: 6.3 10*3/uL (ref 4.0–10.5)

## 2017-09-22 LAB — SEDIMENTATION RATE: SED RATE: 12 mm/h (ref 0–30)

## 2017-09-22 LAB — IBC PANEL
IRON: 63 ug/dL (ref 42–145)
SATURATION RATIOS: 16.8 % — AB (ref 20.0–50.0)
Transferrin: 268 mg/dL (ref 212.0–360.0)

## 2017-09-22 LAB — URIC ACID: Uric Acid, Serum: 4.5 mg/dL (ref 2.4–7.0)

## 2017-09-22 LAB — C-REACTIVE PROTEIN: CRP: 0.1 mg/dL — AB (ref 0.5–20.0)

## 2017-09-22 LAB — VITAMIN B12: VITAMIN B 12: 376 pg/mL (ref 211–911)

## 2017-09-22 LAB — TSH: TSH: 2.02 u[IU]/mL (ref 0.35–4.50)

## 2017-09-22 LAB — FERRITIN: FERRITIN: 143.3 ng/mL (ref 10.0–291.0)

## 2017-09-22 LAB — VITAMIN D 25 HYDROXY (VIT D DEFICIENCY, FRACTURES): VITD: 27.83 ng/mL — ABNORMAL LOW (ref 30.00–100.00)

## 2017-09-22 NOTE — Assessment & Plan Note (Signed)
Awaiting labs to rule out other pathological findings.  Discussed increasing Lexapro if it seems to be helping.

## 2017-09-22 NOTE — Assessment & Plan Note (Signed)
Bilateral injections given today.  Discussed icing regimen and home exercises.  Discussed which activities to do which wants to avoid.  Discussed medications over-the-counter.  Patient needs laboratory work-up to see if any other reason for patient's significant amount of pain.  Follow-up again in 4 to 6 weeks could be a candidate for Visco supplementation

## 2017-09-22 NOTE — Patient Instructions (Addendum)
Good to see you  Ice 20 minutes 2 times daily. Usually after activity and before bed. Tart cherry extract any dose at night Lets get labs today and see what comes out.  Injected the knees and will get approval for monovisc.  I will go through a lot more of the notes.  See me again in 4 weeks to make sure you are doing well

## 2017-09-22 NOTE — Assessment & Plan Note (Signed)
Back pain.  Severe.  Has been getting injections from another facility.  We discussed the possible need for further imaging again.  Patient will be sent to Dr. Ollen Bowl for the possibility of other interventions.  Already has a stimulator.  I believe the patient's dementia unfortunately also plays a role and likely an underlying chronic pain syndrome.  May need to discuss other medications.  Per patient's daughter who is with her at bedside since she has started the Lexapro recently has been making some improvement and may need to titrate up.  We will discuss again in 4 weeks

## 2017-09-27 LAB — PTH, INTACT AND CALCIUM
Calcium: 10.1 mg/dL (ref 8.6–10.4)
PTH: 70 pg/mL — ABNORMAL HIGH (ref 14–64)

## 2017-09-27 LAB — ANA: ANA: NEGATIVE

## 2017-09-27 LAB — CYCLIC CITRUL PEPTIDE ANTIBODY, IGG: Cyclic Citrullin Peptide Ab: <16 UNITS

## 2017-09-27 LAB — ANGIOTENSIN CONVERTING ENZYME: Angiotensin-Converting Enzyme: 23 U/L (ref 9–67)

## 2017-09-27 LAB — CALCIUM, IONIZED: Calcium, Ion: 5.35 mg/dL (ref 4.8–5.6)

## 2017-09-27 LAB — RHEUMATOID FACTOR: Rhuematoid fact SerPl-aCnc: <14 IU/mL (ref <14)

## 2017-10-01 ENCOUNTER — Telehealth: Payer: Self-pay

## 2017-10-01 NOTE — Telephone Encounter (Signed)
Called patient and talked to her daughter. Told her lab results and that Dr. Katrinka BlazingSmith suggest she take 4000 IU of Vitamin D.

## 2017-10-01 NOTE — Progress Notes (Signed)
Done

## 2017-10-01 NOTE — Telephone Encounter (Signed)
-----   Message from Judi SaaZachary M Smith, DO sent at 10/01/2017  5:11 AM EDT ----- Call patient tell her vitamin D is low.  Tell her to take 4000 international units daily otherwise labs fairly normal

## 2017-10-18 NOTE — Progress Notes (Signed)
[name] [address] [place], [place] [address] Phone: [phone] Subjective:     CC: Bilateral knee pain  BJY:[id number]  Chelsea Maddox is a 82 y.o. female coming in with complaint of bilateral knee pain. States that her knees are doing alright. Family believe she needs to go to PT.  Patient since some of the injections were better.  Does have some difficulty remembering how bad they were previously.  Still has discomfort at all times.  Does have approval for Visco supplementation though.     Past Medical History:  Diagnosis Date  . Allergy   . Arthritis   . Chicken pox   . Generalized headaches   . Hypertension   . Thyroid disease   . UTI (urinary tract infection)    Past Surgical History:  Procedure Laterality Date  . ABDOMINAL HYSTERECTOMY    . SACRAL NERVE STIMULATOR PLACEMENT  2018   Social History   Socioeconomic History  . Marital status: Married    Spouse name: Not on file  . Number of children: Not on file  . Years of education: Not on file  . Highest education level: Not on file  Occupational History  . Not on file  Social Needs  . Financial resource strain: Not on file  . Food insecurity:    Worry: Not on file    Inability: Not on file  . Transportation needs:    Medical: Not on file    Non-medical: Not on file  Tobacco Use  . Smoking status: Not on file  Substance and Sexual Activity  . Alcohol use: Not on file  . Drug use: Not on file  . Sexual activity: Not on file  Lifestyle  . Physical activity:    Days per week: Not on file    Minutes per session: Not on file  . Stress: Not on file  Relationships  . Social connections:    Talks on phone: Not on file    Gets together: Not on file    Attends religious service: Not on file    Active member of club or organization: Not on file    Attends meetings of clubs or organizations: Not on file    Relationship status: Not on file  Other Topics Concern  . Not on  file  Social History Narrative  . Not on file   Not on File Family History  Problem Relation Age of Onset  . Arthritis Mother   . Diabetes Mother   . Hearing loss Mother   . Hyperlipidemia Mother   . Hypertension Mother   . Arthritis Father   . COPD Father   . Hearing loss Father   . Heart disease Father   . Hyperlipidemia Father   . Hypertension Father   . Kidney disease Father   . Arthritis Sister   . Hypertension Sister   . Arthritis Sister   . Hearing loss Sister   . Hypertension Sister   . Early death Brother   . Hyperlipidemia Brother   . Hypertension Brother   . Arthritis Brother   . Hypertension Brother      Current Outpatient Medications (Cardiovascular):  .  losartan-hydrochlorothiazide (HYZAAR) 100-12.5 MG tablet, Take 1 tablet by mouth daily. .  simvastatin (ZOCOR) 20 MG tablet, Take 20 mg by mouth daily.  Current Outpatient Medications (Respiratory):  .  benzonatate (TESSALON) 100 MG capsule, Take by mouth 3 (three) times daily as needed for cough.  Current Outpatient  Medications (Analgesics):  .  meloxicam (MOBIC) 15 MG tablet, Take 15 mg by mouth daily.   Current Outpatient Medications (Other):  .  cholecalciferol (VITAMIN D) 1000 units tablet, Take 5,000 Units by mouth daily. Marland Kitchen  escitalopram (LEXAPRO) 10 MG tablet, Take 10 mg by mouth daily. Marland Kitchen  Resveratrol 100 MG CAPS, Take 150 mg by mouth. .  donepezil (ARICEPT) 10 MG tablet, Take 10 mg by mouth at bedtime. .  meclizine (ANTIVERT) 12.5 MG tablet, Take 12.5 mg by mouth 2 (two) times daily.  .  memantine (NAMENDA) 10 MG tablet, Take 10 mg by mouth daily.     Past medical history, social, surgical and family history all reviewed in electronic medical record.  No pertanent information unless stated regarding to the chief complaint.   Review of Systems:  No headache, visual changes, nausea, vomiting, diarrhea, constipation, dizziness, abdominal pain, skin rash, fevers, chills, night sweats, weight  loss, swollen lymph nodes, body aches, joint swelling,  chest pain, shortness of breath, mood changes.  Positive muscle aches  Objective  Blood pressure 122/82, pulse (!) 56, height 5\' 5"  (1.651 m), weight 166 lb (75.3 kg), SpO2 97 %.    General: No apparent distress alert and oriented x3 mood and affect normal, dressed appropriately.  HEENT: Pupils equal, extraocular movements intact  Respiratory: Patient's speak in full sentences and does not appear short of breath  Cardiovascular: No lower extremity edema, non tender, no erythema  Skin: Warm dry intact with no signs of infection or rash on extremities or on axial skeleton.  Abdomen: Soft nontender  Neuro: Cranial nerves II through XII are intact, neurovascularly intact in all extremities with 2+ DTRs and 2+ pulses.  Lymph: No lymphadenopathy of posterior or anterior cervical chain or axillae bilaterally.  Gait antalgic MSK:  tender with mild limited range of motion and good stability and symmetric strength and tone of shoulders, elbows, wrist, hip, and ankles bilaterally.  Knee: Bilateral valgus deformity noted.  Abnormal thigh to calf ratio.  Tender to palpation over medial and PF joint line.  ROM full in flexion and extension and lower leg rotation. instability with valgus force.  painful patellar compression. Patellar glide with moderate crepitus. Patellar and quadriceps tendons unremarkable. Hamstring and quadriceps strength is normal.  After informed written and verbal consent, patient was seated on exam table. Right knee was prepped with alcohol swab and utilizing anterolateral approach, patient's right knee space was injected with 22 mg/mL of risk (sodium hyaluronate) in a prefilled syringe was injected easily into the knee through a 22-gauge needle..Patient tolerated the procedure well without immediate complications.  After informed written and verbal consent, patient was seated on exam table. Left knee was prepped with  alcohol swab and utilizing anterolateral approach, patient's left knee space was injected with 20 mg per Monovisc (sodium hyaluronate) in a prefilled syringe was injected easily into the knee through a 22-gauge needle..Patient tolerated the procedure well without immediate complications.    Impression and Recommendations:     This case required medical decision making of moderate complexity. The above documentation has been reviewed and is accurate and complete Judi Saa, DO       Note: This dictation was prepared with Dragon dictation along with smaller phrase technology. Any transcriptional errors that result from this process are unintentional.

## 2017-10-19 ENCOUNTER — Ambulatory Visit (INDEPENDENT_AMBULATORY_CARE_PROVIDER_SITE_OTHER): Payer: Medicare Other | Admitting: Family Medicine

## 2017-10-19 DIAGNOSIS — M170 Bilateral primary osteoarthritis of knee: Secondary | ICD-10-CM | POA: Diagnosis not present

## 2017-10-19 NOTE — Assessment & Plan Note (Signed)
Monovisc bilaterally given.  Discussed icing regimen and home exercise.  Discussed which activities to do which wants to avoid.  Patient will be sent to formal physical therapy for balance and coordination and strengthening.  Follow-up again in 6 to 8 weeks

## 2017-10-19 NOTE — Patient Instructions (Signed)
Good to see you  Ice is your friend Stay active Monovisc given today  PT will be calling you  Continue the other vitamins This will take a month to work  See me again in 2-3 months

## 2017-10-28 DIAGNOSIS — Z23 Encounter for immunization: Secondary | ICD-10-CM | POA: Diagnosis not present

## 2017-11-09 DIAGNOSIS — H35033 Hypertensive retinopathy, bilateral: Secondary | ICD-10-CM | POA: Diagnosis not present

## 2017-11-09 DIAGNOSIS — H353131 Nonexudative age-related macular degeneration, bilateral, early dry stage: Secondary | ICD-10-CM | POA: Diagnosis not present

## 2017-11-09 DIAGNOSIS — Z961 Presence of intraocular lens: Secondary | ICD-10-CM | POA: Diagnosis not present

## 2017-11-25 ENCOUNTER — Encounter: Payer: Self-pay | Admitting: Nurse Practitioner

## 2017-11-25 ENCOUNTER — Ambulatory Visit (INDEPENDENT_AMBULATORY_CARE_PROVIDER_SITE_OTHER): Payer: Medicare Other | Admitting: Nurse Practitioner

## 2017-11-25 VITALS — BP 156/92 | HR 65 | Temp 97.8°F | Ht 65.0 in | Wt 164.0 lb

## 2017-11-25 DIAGNOSIS — Z1231 Encounter for screening mammogram for malignant neoplasm of breast: Secondary | ICD-10-CM | POA: Diagnosis not present

## 2017-11-25 DIAGNOSIS — G8929 Other chronic pain: Secondary | ICD-10-CM

## 2017-11-25 DIAGNOSIS — M545 Low back pain: Secondary | ICD-10-CM

## 2017-11-25 DIAGNOSIS — I10 Essential (primary) hypertension: Secondary | ICD-10-CM | POA: Diagnosis not present

## 2017-11-25 DIAGNOSIS — F419 Anxiety disorder, unspecified: Secondary | ICD-10-CM | POA: Diagnosis not present

## 2017-11-25 DIAGNOSIS — E785 Hyperlipidemia, unspecified: Secondary | ICD-10-CM | POA: Diagnosis not present

## 2017-11-25 DIAGNOSIS — M170 Bilateral primary osteoarthritis of knee: Secondary | ICD-10-CM | POA: Diagnosis not present

## 2017-11-25 DIAGNOSIS — R748 Abnormal levels of other serum enzymes: Secondary | ICD-10-CM

## 2017-11-25 DIAGNOSIS — E2839 Other primary ovarian failure: Secondary | ICD-10-CM

## 2017-11-25 DIAGNOSIS — G894 Chronic pain syndrome: Secondary | ICD-10-CM | POA: Diagnosis not present

## 2017-11-25 DIAGNOSIS — F0390 Unspecified dementia without behavioral disturbance: Secondary | ICD-10-CM

## 2017-11-25 MED ORDER — MELOXICAM 15 MG PO TABS: 7.5000 mg | ORAL_TABLET | Freq: Every day | ORAL | Status: DC | PRN

## 2017-11-25 NOTE — Progress Notes (Signed)
Careteam: Patient Care Team: Sharon SellerEubanks, Janin Kozlowski K, NP as PCP - General (Geriatric Medicine)  Advanced Directive information Does Patient Have a Medical Advance Directive?: Yes, Type of Advance Directive: Healthcare Power of Attorney  No Known Allergies  Chief Complaint  Patient presents with  . Medical Management of Chronic Issues    Pt is being seen to establish care. [place]      HPI: Patient is a 82 y.o. female seen in the office today to establish care   Hypertension- blood pressure elevated, sometimes worse when back pain gets bad. Taking losartan-hctz 100-12.5 mg by mouth daily (takes at night)  Hyperlipidemia- zocor 20 mg daily   Anxiety/depression- lexapro 10 mg by mouth daily which was recently started and daughter states it has helpd.   Vertigo- uses meclizine 12.5 mg by mouth twice daily as needed- has not used in over a year  She went to Dr Katrinka BlazingSmith due to OA of the knees- got injection which was beneficial.  Daughter states she [place]- has had chronic pain since her 40s in multiple joints and muscles. Has neuropathy- noted to have arthritis in her neck- injected nerves in neck to "deaden" the pain.  She was mobic 15 mg daily - but daughter has decreased this to as needed  Got a nerve stimulator in her back to help control pain in her leg.  Reports her B12 level was 4193 in August of 2017 when checked, not on supplement.   noted to have low vit D and now she is on Vit D supplement   Dementia- daughter has been following Dr Stefan ChurchBredesen, read the book the end of alzheimer's'. He focus on inflammatory processes.  daughter giving her Resveratrol 100 mg. She was diagnosised 5-7 years ago. Reports CT of head done. Has been on aricept and namenda  Feels like aricept made her more agitation when she was on the medication.  Has appt at [facility]- appt not until February ( made in august) ` Has  [place] in [place].   Using 2L o2 at night due to drop in sats. Will need oxygen company.   Review of Systems:  Review of Systems  Unable to perform ROS: Dementia    Past Medical History:  Diagnosis Date  . Allergy   . Arthritis   . Chicken pox   . Dementia (HCC)   . Generalized headaches   . Hypertension   . Neuropathy   . Thyroid disease   . UTI (urinary tract infection)    Past Surgical History:  Procedure Laterality Date  . ABDOMINAL HYSTERECTOMY    . SACRAL NERVE STIMULATOR PLACEMENT  2018  . TRIGGER FINGER RELEASE     Social History:   reports that she has never smoked. She has never used smokeless tobacco. She reports that she does not drink alcohol or use drugs.  Family History  Problem Relation Age of Onset  . Arthritis Mother   . Diabetes Mother   . Hearing loss Mother   . Hyperlipidemia Mother   . Hypertension Mother   . Arthritis Father   . COPD Father   . Hearing loss Father   . Heart disease Father   . Hyperlipidemia Father   . Hypertension Father   . Kidney disease Father   . Anuerysm Father        aortic arch  . Dementia Father   . Arthritis Sister  86  . Hypertension Sister   . Macular degeneration Sister   . Aneurysm Sister        aortic  . Arthritis Sister   . Hearing loss Sister   . Hypertension Sister   . Aneurysm Sister        brain  . Early death Brother 78  . Hyperlipidemia Brother   . Hypertension Brother   . Aneurysm Brother        aortic  . Arthritis Brother   . Hypertension Brother   . Gout Brother   . Macular degeneration Brother   . Aneurysm Brother        aortic  . Other Brother 68       [employer] [place]  . Hyperlipidemia Daughter 32  . Hypertension Daughter   . Depression Daughter   . Arthritis Daughter   . Arthritis Daughter   . Hypertension Son     Medications: Patient's Medications  New Prescriptions   No medications on file  Previous Medications   BENZONATATE (TESSALON)  100 MG CAPSULE    Take by mouth 3 (three) times daily as needed for cough.   CHOLECALCIFEROL (VITAMIN D) 1000 UNITS TABLET    Take 5,000 Units by mouth daily.   ESCITALOPRAM (LEXAPRO) 10 MG TABLET    Take 10 mg by mouth daily.   LOSARTAN-HYDROCHLOROTHIAZIDE (HYZAAR) 100-12.5 MG TABLET    Take 1 tablet by mouth daily.   MECLIZINE (ANTIVERT) 12.5 MG TABLET    Take 12.5 mg by mouth 2 (two) times daily.    MELOXICAM (MOBIC) 15 MG TABLET    Take 15 mg by mouth daily.   RESVERATROL 100 MG CAPS    Take 100 mg by mouth.    SIMVASTATIN (ZOCOR) 20 MG TABLET    Take 20 mg by mouth daily.  Modified Medications   No medications on file  Discontinued Medications   DONEPEZIL (ARICEPT) 10 MG TABLET    Take 10 mg by mouth at bedtime.   MEMANTINE (NAMENDA) 10 MG TABLET    Take 10 mg by mouth daily.      Physical Exam:  Vitals:   11/25/17 0902  BP: (!) 156/92  Pulse: 65  Temp: 97.8 F (36.6 C)  TempSrc: Oral  SpO2: 98%  Weight: 164 lb (74.4 kg)  Height: 5\' 5"  (1.651 m)   Body mass index is 27.29 kg/m.  Physical Exam  Constitutional: She appears well-developed and well-nourished. No distress.  HENT:  Head: Normocephalic and atraumatic.  Mouth/Throat: Oropharynx is clear and moist. No oropharyngeal exudate.  Eyes: Pupils are equal, round, and reactive to light. Conjunctivae are normal.  Neck: Normal range of motion. Neck supple.  Cardiovascular: Normal rate, regular rhythm and normal heart sounds.  Pulmonary/Chest: Effort normal and breath sounds normal.  Abdominal: Soft. Bowel sounds are normal.  Musculoskeletal: She exhibits no edema or tenderness.  Neurological: She is alert.  Skin: Skin is warm and dry. She is not diaphoretic.  Psychiatric: She has a normal mood and affect. Cognition and memory are impaired. She exhibits abnormal recent memory.    Labs reviewed: Basic Metabolic Panel: Recent Labs    09/22/17 1526  NA 143  K 4.0  CL 105  CO2 29  GLUCOSE 91  BUN 31*    CREATININE 1.09  CALCIUM 10.1  9.6  TSH 2.02   Liver Function Tests: Recent Labs    09/22/17 1526  AST 17  ALT 17  ALKPHOS 84  BILITOT 0.6  PROT 7.6  ALBUMIN 4.3   No results for input(s): LIPASE, AMYLASE in the last 8760 hours. No results for input(s): AMMONIA in the last 8760 hours. CBC: Recent Labs    09/22/17 1526  WBC 6.3  NEUTROABS 4.1  HGB 13.9  HCT 41.6  MCV 88.0  PLT 246.0   Lipid Panel: No results for input(s): CHOL, HDL, LDLCALC, TRIG, CHOLHDL, LDLDIRECT in the last 8760 hours. TSH: Recent Labs    09/22/17 1526  TSH 2.02   A1C: No results found for: HGBA1C   Assessment/Plan 1. Dementia without behavioral disturbance, unspecified dementia type [facility] Daughter has follow up/evaluation scheduled at [facility]. Reports they have already attempted aricept and namenda which did not provide benefit and felt like she was more agitated on these medications.   2. Primary osteoarthritis of both knees Has seen Dr Katrinka Blazing due to OA s/p injections in bilateral knees which provided some benefit. Continues on meloxiam, we discussed risk of NSAIDS and daughter plans to stop using routinely and save for PRN use.  -plan to have PT ordered through sports medicine.  - meloxicam (MOBIC) 15 MG tablet; Take 0.5-1 tablets (7.5-15 mg total) by mouth daily as needed for pain.  3. Chronic pain syndrome -has had chronic pain to lumbar spine, knees, musces, etc. Stable at this time  4. Chronic bilateral low back pain, unspecified whether sciatica present  pt with hx of chronic low back pain and what appears to be sciatica, now has nerve stimulator in back. May need referal to neurosurgeon in the future  5. Estrogen deficiency - DG Bone Density; Future  6. Encounter for screening mammogram for breast cancer - MM DIGITAL SCREENING BILATERAL; Future  7. Elevated vitamin B12 level - Vitamin B12  8. Hyperlipidemia, unspecified hyperlipidemia type Currently on  zocor with dietary modifications.  - Lipid Panel  9. HTN Continues on losartan-hctz, will follow up labs today.   10. Anxiety Controlled on lexapro 10 mg daily   Next appt: 11/26/2017 Shanda Bumps K. Biagio Borg  [facility] [phone]

## 2017-11-25 NOTE — Patient Instructions (Signed)
Follow up in 4 weeks for AWV and physical

## 2017-11-26 LAB — LIPID PANEL
Cholesterol: 267 mg/dL — ABNORMAL HIGH (ref <200)
HDL: 65 mg/dL (ref >50)
LDL CHOLESTEROL (CALC): 179 mg/dL — AB
Non-HDL Cholesterol (Calc): 202 mg/dL (calc) — ABNORMAL HIGH (ref <130)
Total CHOL/HDL Ratio: 4.1 (calc) (ref <5.0)
Triglycerides: 107 mg/dL (ref <150)

## 2017-11-26 LAB — VITAMIN B12: Vitamin B-12: 431 pg/mL (ref 200–1100)

## 2017-11-28 DIAGNOSIS — I10 Essential (primary) hypertension: Secondary | ICD-10-CM | POA: Insufficient documentation

## 2017-11-28 DIAGNOSIS — E785 Hyperlipidemia, unspecified: Secondary | ICD-10-CM | POA: Insufficient documentation

## 2017-11-28 DIAGNOSIS — F419 Anxiety disorder, unspecified: Secondary | ICD-10-CM | POA: Insufficient documentation

## 2017-12-20 ENCOUNTER — Ambulatory Visit (INDEPENDENT_AMBULATORY_CARE_PROVIDER_SITE_OTHER): Payer: Medicare Other | Admitting: Nurse Practitioner

## 2017-12-20 ENCOUNTER — Ambulatory Visit: Payer: Medicare Other

## 2017-12-20 ENCOUNTER — Ambulatory Visit: Payer: Self-pay

## 2017-12-20 ENCOUNTER — Encounter: Payer: Self-pay | Admitting: Nurse Practitioner

## 2017-12-20 ENCOUNTER — Ambulatory Visit: Payer: Self-pay | Admitting: Nurse Practitioner

## 2017-12-20 ENCOUNTER — Other Ambulatory Visit: Payer: Self-pay | Admitting: Nurse Practitioner

## 2017-12-20 VITALS — BP 148/72 | HR 62 | Temp 98.2°F | Ht 64.0 in | Wt 163.2 lb

## 2017-12-20 DIAGNOSIS — G8929 Other chronic pain: Secondary | ICD-10-CM

## 2017-12-20 DIAGNOSIS — Z0000 Encounter for general adult medical examination without abnormal findings: Secondary | ICD-10-CM | POA: Diagnosis not present

## 2017-12-20 DIAGNOSIS — M545 Low back pain: Principal | ICD-10-CM

## 2017-12-20 MED ORDER — ZOSTER VAC RECOMB ADJUVANTED 50 MCG/0.5ML IM SUSR: 0.5000 mL | Freq: Once | INTRAMUSCULAR | 1 refills | Status: AC

## 2017-12-20 MED ORDER — ESCITALOPRAM OXALATE 10 MG PO TABS: 10.0000 mg | ORAL_TABLET | Freq: Every day | ORAL | 1 refills | Status: DC

## 2017-12-20 MED ORDER — LOSARTAN POTASSIUM-HCTZ 100-12.5 MG PO TABS: 1.0000 | ORAL_TABLET | Freq: Every day | ORAL | 1 refills | Status: DC

## 2017-12-20 NOTE — Patient Instructions (Addendum)
Ms. Chelsea Maddox , Thank you for taking time to come for your Medicare Wellness Visit. I appreciate your ongoing commitment to your health goals. Please review the following plan we discussed and let me know if I can assist you in the future.   Screening recommendations/referrals: Colonoscopy  Mammogram  Bone Density  Recommended yearly ophthalmology/optometry visit for glaucoma screening and checkup Recommended yearly dental visit for hygiene and checkup  Vaccinations: Influenza vaccine  Pneumococcal vaccine  Tdap vaccine  Shingles vaccine   Advanced directives:   Next appointment: 3 months for EV   Preventive Care 80 Years and Older, Female Preventive care refers to lifestyle choices and visits with your health care provider that can promote health and wellness. What does preventive care include?  A yearly physical exam. This is also called an annual well check.  Dental exams once or twice a year.  Routine eye exams. Ask your health care provider how often you should have your eyes checked.  Personal lifestyle choices, including:  Daily care of your teeth and gums.  Regular physical activity.  Eating a healthy diet.  Avoiding tobacco and drug use.  Limiting alcohol use.  Practicing safe sex.  Taking low-dose aspirin every day.  Taking vitamin and mineral supplements as recommended by your health care provider. What happens during an annual well check? The services and screenings done by your health care provider during your annual well check will depend on your age, overall health, lifestyle risk factors, and family history of disease. Counseling  Your health care provider may ask you questions about your:  Alcohol use.  Tobacco use.  Drug use.  Emotional well-being.  Home and relationship well-being.  Sexual activity.  Eating habits.  History of falls.  Memory and ability to understand (cognition).  Work and work Statistician.  Reproductive  health. Screening  You may have the following tests or measurements:  Height, weight, and BMI.  Blood pressure.  Lipid and cholesterol levels. These may be checked every 5 years, or more frequently if you are over 80 years old.  Skin check.  Lung cancer screening. You may have this screening every year starting at age 14 if you have a 30-pack-year history of smoking and currently smoke or have quit within the past 15 years.  Fecal occult blood test (FOBT) of the stool. You may have this test every year starting at age 70.  Flexible sigmoidoscopy or colonoscopy. You may have a sigmoidoscopy every 5 years or a colonoscopy every 10 years starting at age 80.  Hepatitis C blood test.  Hepatitis B blood test.  Sexually transmitted disease (STD) testing.  Diabetes screening. This is done by checking your blood sugar (glucose) after you have not eaten for a while (fasting). You may have this done every 1-3 years.  Bone density scan. This is done to screen for osteoporosis. You may have this done starting at age 19.  Mammogram. This may be done every 1-2 years. Talk to your health care provider about how often you should have regular mammograms. Talk with your health care provider about your test results, treatment options, and if necessary, the need for more tests. Vaccines  Your health care provider may recommend certain vaccines, such as:  Influenza vaccine. This is recommended every year.  Tetanus, diphtheria, and acellular pertussis (Tdap, Td) vaccine. You may need a Td booster every 10 years.  Zoster vaccine. You may need this after age 80.  Pneumococcal 13-valent conjugate (PCV13) vaccine. One dose is recommended  after age 80.  Pneumococcal polysaccharide (PPSV23) vaccine. One dose is recommended after age 10. Talk to your health care provider about which screenings and vaccines you need and how often you need them. This information is not intended to replace advice given to  you by your health care provider. Make sure you discuss any questions you have with your health care provider. Document Released: 01/25/2015 Document Revised: 09/18/2015 Document Reviewed: 10/30/2014 Elsevier Interactive Patient Education  2017 [place] Prevention in the Home Falls can cause injuries. They can happen to people of all ages. There are many things you can do to make your home safe and to help prevent falls. What can I do on the outside of my home?  Regularly fix the edges of walkways and driveways and fix any cracks.  Remove anything that might make you trip as you walk through a door, such as a raised step or threshold.  Trim any bushes or trees on the path to your home.  Use bright outdoor lighting.  Clear any walking paths of anything that might make someone trip, such as rocks or tools.  Regularly check to see if handrails are loose or broken. Make sure that both sides of any steps have handrails.  Any raised decks and porches should have guardrails on the edges.  Have any leaves, snow, or ice cleared regularly.  Use sand or salt on walking paths during winter.  Clean up any spills in your garage right away. This includes oil or grease spills. What can I do in the bathroom?  Use night lights.  Install grab bars by the toilet and in the tub and shower. Do not use towel bars as grab bars.  Use non-skid mats or decals in the tub or shower.  If you need to sit down in the shower, use a plastic, non-slip stool.  Keep the floor dry. Clean up any water that spills on the floor as soon as it happens.  Remove soap buildup in the tub or shower regularly.  Attach bath mats securely with double-sided non-slip rug tape.  Do not have throw rugs and other things on the floor that can make you trip. What can I do in the bedroom?  Use night lights.  Make sure that you have a light by your bed that is easy to reach.  Do not use any sheets or blankets that  are too big for your bed. They should not hang down onto the floor.  Have a firm chair that has side arms. You can use this for support while you get dressed.  Do not have throw rugs and other things on the floor that can make you trip. What can I do in the kitchen?  Clean up any spills right away.  Avoid walking on wet floors.  Keep items that you use a lot in easy-to-reach places.  If you need to reach something above you, use a strong step stool that has a grab bar.  Keep electrical cords out of the way.  Do not use floor polish or wax that makes floors slippery. If you must use wax, use non-skid floor wax.  Do not have throw rugs and other things on the floor that can make you trip. What can I do with my stairs?  Do not leave any items on the stairs.  Make sure that there are handrails on both sides of the stairs and use them. Fix handrails that are broken or loose. Make sure  that handrails are as long as the stairways.  Check any carpeting to make sure that it is firmly attached to the stairs. Fix any carpet that is loose or worn.  Avoid having throw rugs at the top or bottom of the stairs. If you do have throw rugs, attach them to the floor with carpet tape.  Make sure that you have a light switch at the top of the stairs and the bottom of the stairs. If you do not have them, ask someone to add them for you. What else can I do to help prevent falls?  Wear shoes that:  Do not have high heels.  Have rubber bottoms.  Are comfortable and fit you well.  Are closed at the toe. Do not wear sandals.  If you use a stepladder:  Make sure that it is fully opened. Do not climb a closed stepladder.  Make sure that both sides of the stepladder are locked into place.  Ask someone to hold it for you, if possible.  Clearly mark and make sure that you can see:  Any grab bars or handrails.  First and last steps.  Where the edge of each step is.  Use tools that help you  move around (mobility aids) if they are needed. These include:  Canes.  Walkers.  Scooters.  Crutches.  Turn on the lights when you go into a dark area. Replace any light bulbs as soon as they burn out.  Set up your furniture so you have a clear path. Avoid moving your furniture around.  If any of your floors are uneven, fix them.  If there are any pets around you, be aware of where they are.  Review your medicines with your doctor. Some medicines can make you feel dizzy. This can increase your chance of falling. Ask your doctor what other things that you can do to help prevent falls. This information is not intended to replace advice given to you by your health care provider. Make sure you discuss any questions you have with your health care provider. Document Released: 10/25/2008 Document Revised: 06/06/2015 Document Reviewed: 02/02/2014 Elsevier Interactive Patient Education  2017 [place] 65 Years and Older, Female Preventive care refers to lifestyle choices and visits with your health care provider that can promote health and wellness. What does preventive care include?  A yearly physical exam. This is also called an annual well check.  Dental exams once or twice a year.  Routine eye exams. Ask your health care provider how often you should have your eyes checked.  Personal lifestyle choices, including: ? Daily care of your teeth and gums. ? Regular physical activity. ? Eating a healthy diet. ? Avoiding tobacco and drug use. ? Limiting alcohol use. ? Practicing safe sex. ? Taking low-dose aspirin every day. ? Taking vitamin and mineral supplements as recommended by your health care provider. What happens during an annual well check? The services and screenings done by your health care provider during your annual well check will depend on your age, overall health, lifestyle risk factors, and family history of disease. Counseling Your  health care provider may ask you questions about your:  Alcohol use.  Tobacco use.  Drug use.  Emotional well-being.  Home and relationship well-being.  Sexual activity.  Eating habits.  History of falls.  Memory and ability to understand (cognition).  Work and work Statistician.  Reproductive health.  Screening You may have the following tests or  measurements:  Height, weight, and BMI.  Blood pressure.  Lipid and cholesterol levels. These may be checked every 5 years, or more frequently if you are over 53 years old.  Skin check.  Lung cancer screening. You may have this screening every year starting at age 74 if you have a 30-pack-year history of smoking and currently smoke or have quit within the past 15 years.  Fecal occult blood test (FOBT) of the stool. You may have this test every year starting at age 26.  Flexible sigmoidoscopy or colonoscopy. You may have a sigmoidoscopy every 5 years or a colonoscopy every 10 years starting at age 88.  Hepatitis C blood test.  Hepatitis B blood test.  Sexually transmitted disease (STD) testing.  Diabetes screening. This is done by checking your blood sugar (glucose) after you have not eaten for a while (fasting). You may have this done every 1-3 years.  Bone density scan. This is done to screen for osteoporosis. You may have this done starting at age 61.  Mammogram. This may be done every 1-2 years. Talk to your health care provider about how often you should have regular mammograms.  Talk with your health care provider about your test results, treatment options, and if necessary, the need for more tests. Vaccines Your health care provider may recommend certain vaccines, such as:  Influenza vaccine. This is recommended every year.  Tetanus, diphtheria, and acellular pertussis (Tdap, Td) vaccine. You may need a Td booster every 10 years.  Varicella vaccine. You may need this if you have not been  vaccinated.  Zoster vaccine. You may need this after age 60.  Measles, mumps, and rubella (MMR) vaccine. You may need at least one dose of MMR if you were born in 1957 or later. You may also need a second dose.  Pneumococcal 13-valent conjugate (PCV13) vaccine. One dose is recommended after age 79.  Pneumococcal polysaccharide (PPSV23) vaccine. One dose is recommended after age 55.  Meningococcal vaccine. You may need this if you have certain conditions.  Hepatitis A vaccine. You may need this if you have certain conditions or if you travel or work in places where you may be exposed to hepatitis A.  Hepatitis B vaccine. You may need this if you have certain conditions or if you travel or work in places where you may be exposed to hepatitis B.  Haemophilus influenzae type b (Hib) vaccine. You may need this if you have certain conditions.  Talk to your health care provider about which screenings and vaccines you need and how often you need them. This information is not intended to replace advice given to you by your health care provider. Make sure you discuss any questions you have with your health care provider. Document Released: 01/25/2015 Document Revised: 09/18/2015 Document Reviewed: 10/30/2014 Elsevier Interactive Patient Education  [employer].

## 2017-12-20 NOTE — Progress Notes (Addendum)
Subjective:   Chelsea Maddox is a 82 y.o. female who presents for Medicare Annual (Subsequent) preventive examination. Cardiac Risk Factors include: advanced age (>15men, >61 women);hypertension;sedentary lifestyle;family history of premature cardiovascular disease     Objective:     Vitals: BP (!) 148/72   Pulse 62   Temp 98.2 F (36.8 C) (Oral)   Ht 5\' 4"  (1.626 m)   Wt 163 lb 3.2 oz (74 kg)   SpO2 97%   BMI 28.01 kg/m   Body mass index is 28.01 kg/m.  Advanced Directives 12/20/2017 11/25/2017  Does Patient Have a Medical Advance Directive? Yes Yes  Type of Advance Directive Healthcare Power of [employer] Power of Attorney  Copy of Healthcare Power of Attorney in Chart? Yes - validated most recent copy scanned in chart (See row information) No - copy requested    Tobacco Social History   Tobacco Use  Smoking Status Never Smoker  Smokeless Tobacco Never Used     Counseling given: Not Answered   Clinical Intake:     Pain : No/denies pain     BMI - recorded: 28.01 Nutritional Status: BMI 25 -29 Overweight Nutritional Risks: Unintentional weight loss Diabetes: No  How often do you need to have someone help you when you read instructions, pamphlets, or other written materials from your doctor or pharmacy?: 5 - Always  Interpreter Needed?: No    Comments: needs help following instructions due to dementia   Past Medical History:  Diagnosis Date  . Allergy   . Arthritis   . Chicken pox   . COPD (chronic obstructive pulmonary disease) (HCC)   . Dementia (HCC)   . DJD (degenerative joint disease)   . Generalized headaches   . Hyperlipidemia   . Hypertension   . Hypertensive retinopathy of both eyes   . Kidney disease   . Macular degeneration   . Neuropathy   . Nuclear sclerotic cataract of both eyes   . Osteoarthritis   . Peripheral neuropathy   . Retinal edema   . Thyroid disease   . UTI (urinary tract infection)   . Vitamin D deficiency     Past Surgical History:  Procedure Laterality Date  . ABDOMINAL HYSTERECTOMY    . SACRAL NERVE STIMULATOR PLACEMENT  2018  . TRIGGER FINGER RELEASE     Family History  Problem Relation Age of Onset  . Arthritis Mother   . Diabetes Mother   . Hearing loss Mother   . Hyperlipidemia Mother   . Hypertension Mother   . Arthritis Father   . COPD Father   . Hearing loss Father   . Heart disease Father   . Hyperlipidemia Father   . Hypertension Father   . Kidney disease Father   . Anuerysm Father        aortic arch  . Dementia Father   . Arthritis Sister 64  . Hypertension Sister   . Macular degeneration Sister   . Aneurysm Sister        aortic  . Arthritis Sister   . Hearing loss Sister   . Hypertension Sister   . Aneurysm Sister        brain  . Early death Brother 27  . Hyperlipidemia Brother   . Hypertension Brother   . Aneurysm Brother        aortic  . Arthritis Brother   . Hypertension Brother   . Gout Brother   . Macular degeneration Brother   . Aneurysm Brother  aortic  . Other Brother 32       [employer] [place]  . Hyperlipidemia Daughter 82  . Hypertension Daughter   . Depression Daughter   . Arthritis Daughter   . Arthritis Daughter   . Hypertension Son    Social History   Socioeconomic History  . Marital status: Married    Spouse name: Not on file  . Number of children: Not on file  . Years of education: Not on file  . Highest education level: Not on file  Occupational History  . Not on file  Social Needs  . Financial resource strain: Not on file  . Food insecurity:    Worry: Not on file    Inability: Not on file  . Transportation needs:    Medical: Not on file    Non-medical: Not on file  Tobacco Use  . Smoking status: Never Smoker  . Smokeless tobacco: Never Used  Substance and Sexual Activity  . Alcohol use: Never    Frequency: Never  . Drug use: Never  . Sexual activity: Not Currently  Lifestyle  . Physical activity:    Days  per week: Not on file    Minutes per session: Not on file  . Stress: Not on file  Relationships  . Social connections:    Talks on phone: Not on file    Gets together: Not on file    Attends religious service: Not on file    Active member of club or organization: Not on file    Attends meetings of clubs or organizations: Not on file    Relationship status: Not on file  Other Topics Concern  . Not on file  Social History Narrative   Social History      Diet?       Do you drink/eat things with caffeine? yes      Marital status?        married                            What year were you married? 1957      Do you live in a house, apartment, assisted living, condo, trailer, etc.? house      Is it one or more stories? 1      How many persons live in your home? 4      Do you have any pets in your home? (please list) 3 dogs, 1 cat      Highest level of education completed? Some college      Current or past profession: homemaker      Do you exercise?       no                               Type & how often?      Advanced Directives      Do you have a living will? yes      Do you have a DNR form?                                  If not, do you want to discuss one? no      Do you have signed POA/HPOA for forms? yes      Functional Status      Do you have difficulty bathing or  dressing yourself? yes      Do you have difficulty preparing food or eating?  yes      Do you have difficulty managing your medications? yes      Do you have difficulty managing your finances? yes      Do you have difficulty affording your medications? no    Outpatient Encounter Medications as of 12/20/2017  Medication Sig  . cholecalciferol (VITAMIN D) 1000 units tablet Take 5,000 Units by mouth daily.  Marland Kitchen. escitalopram (LEXAPRO) 10 MG tablet Take 10 mg by mouth daily.  Marland Kitchen. losartan-hydrochlorothiazide (HYZAAR) 100-12.5 MG tablet Take 1 tablet by mouth daily.  Marland Kitchen. Resveratrol 100 MG CAPS Take 100 mg  by mouth.   . simvastatin (ZOCOR) 20 MG tablet Take 20 mg by mouth daily.  Marland Kitchen. Zoster Vaccine Adjuvanted [facility](SHINGRIX) injection Inject 0.5 mLs into the muscle once.  . [DISCONTINUED] meloxicam (MOBIC) 15 MG tablet Take 0.5-1 tablets (7.5-15 mg total) by mouth daily as needed for pain.   No facility-administered encounter medications on file as of 12/20/2017.     Activities of Daily Living In your present state of health, do you have any difficulty performing the following activities: 12/20/2017  Hearing? N  Vision? Y  Difficulty concentrating or making decisions? Y  Walking or climbing stairs? Y  Dressing or bathing? Y  Doing errands, shopping? Y  Preparing Food and eating ? Y  Comment does not prepare food, husband helps her  Using the Toilet? N  In the past six months, have you accidently leaked urine? N  Do you have problems with loss of bowel control? N  Managing your Medications? Y  Managing your Finances? Y  Housekeeping or managing your Housekeeping? Y    Patient Care Team: Sharon SellerEubanks, Jamelah Sitzer K, NP as PCP - General (Geriatric Medicine)    Assessment:   This is a routine wellness examination for Shantae.  Exercise Activities and Dietary recommendations Current Exercise Habits: The patient does not participate in regular exercise at present, Exercise limited by: psychological condition(s)  Goals   None     Fall Risk Fall Risk  12/20/2017 11/25/2017  Falls in the past year? 0 0   Is the patient's home free of loose throw rugs in walkways, pet beds, electrical cords, etc?   no      Grab bars in the bathroom? yes      Handrails on the stairs?   yes      Adequate lighting?   no   Depression Screen PHQ 2/9 Scores 12/20/2017 11/25/2017  PHQ - 2 Score 0 0     Cognitive Function MMSE - Mini Mental State Exam 12/20/2017  Orientation to time 0  Orientation to Place 0  Registration 3  Attention/ Calculation 3  Recall 0  Language- name 2 objects 2  Language- repeat 0    Language- follow 3 step command 3  Language- read & follow direction 1  Write a sentence 0  Copy design 0  Total score 12        Immunization History  Administered Date(s) Administered  . Influenza, High Dose Seasonal PF 10/20/2017    Qualifies for Shingles Vaccine? Yes; plan to send Rx electronically   Screening Tests Health Maintenance  Topic Date Due  . TETANUS/TDAP  09/15/1956  . DEXA SCAN  09/16/2002  . PNA vac Low Risk Adult (1 of 2 - PCV13) 09/16/2002  . INFLUENZA VACCINE  Completed    Cancer Screenings: Lung: Low Dose CT Chest recommended  if Age 62-80 years, 30 pack-year currently smoking OR have quit w/in 15years. Patient does not qualify. Breast:  Up to date on Mammogram? No   Up to date of Bone Density/Dexa? No Colorectal: yes, colonoscopy UTD  Additional Screenings: Hepatitis C Screening: N/A   Needs pneumonia vaccines and tdap, dexa scan scheduled     Plan:      I have personally reviewed and noted the following in the patient's chart:   . Medical and social history . Use of alcohol, tobacco or illicit drugs  . Current medications and supplements . Functional ability and status . Nutritional status . Physical activity . Advanced directives . List of other physicians . Hospitalizations, surgeries, and ER visits in previous 12 months . Vitals . Screenings to include cognitive, depression, and falls . Referrals and appointments  In addition, I have reviewed and discussed with patient certain preventive protocols, quality metrics, and best practice recommendations. A written personalized care plan for preventive services as well as general preventive health recommendations were provided to patient.     Sharon Seller, NP  12/20/2017

## 2017-12-29 ENCOUNTER — Encounter (HOSPITAL_BASED_OUTPATIENT_CLINIC_OR_DEPARTMENT_OTHER): Payer: Self-pay

## 2017-12-29 ENCOUNTER — Other Ambulatory Visit: Payer: Self-pay

## 2017-12-29 ENCOUNTER — Emergency Department (HOSPITAL_BASED_OUTPATIENT_CLINIC_OR_DEPARTMENT_OTHER): Payer: Medicare Other

## 2017-12-29 ENCOUNTER — Emergency Department (HOSPITAL_BASED_OUTPATIENT_CLINIC_OR_DEPARTMENT_OTHER)
Admission: EM | Admit: 2017-12-29 | Discharge: 2017-12-29 | Disposition: A | Discharge status: Home / Self Care | Payer: Medicare Other | Attending: Emergency Medicine | Admitting: Emergency Medicine

## 2017-12-29 DIAGNOSIS — I4510 Unspecified right bundle-branch block: Secondary | ICD-10-CM | POA: Diagnosis not present

## 2017-12-29 DIAGNOSIS — K760 Fatty (change of) liver, not elsewhere classified: Secondary | ICD-10-CM | POA: Diagnosis not present

## 2017-12-29 DIAGNOSIS — R1110 Vomiting, unspecified: Secondary | ICD-10-CM

## 2017-12-29 DIAGNOSIS — Z79899 Other long term (current) drug therapy: Secondary | ICD-10-CM | POA: Insufficient documentation

## 2017-12-29 DIAGNOSIS — J449 Chronic obstructive pulmonary disease, unspecified: Secondary | ICD-10-CM | POA: Diagnosis not present

## 2017-12-29 DIAGNOSIS — N281 Cyst of kidney, acquired: Secondary | ICD-10-CM | POA: Diagnosis not present

## 2017-12-29 DIAGNOSIS — F0390 Unspecified dementia without behavioral disturbance: Secondary | ICD-10-CM | POA: Insufficient documentation

## 2017-12-29 DIAGNOSIS — I10 Essential (primary) hypertension: Secondary | ICD-10-CM | POA: Diagnosis not present

## 2017-12-29 DIAGNOSIS — R112 Nausea with vomiting, unspecified: Secondary | ICD-10-CM | POA: Diagnosis not present

## 2017-12-29 LAB — CBC WITH DIFFERENTIAL/PLATELET
Abs Immature Granulocytes: 0.03 10*3/uL (ref 0.00–0.07)
BASOS ABS: 0 10*3/uL (ref 0.0–0.1)
Basophils Relative: 0 %
EOS ABS: 0.1 10*3/uL (ref 0.0–0.5)
Eosinophils Relative: 1 %
HCT: 44.3 % (ref 36.0–46.0)
Hemoglobin: 14.2 g/dL (ref 12.0–15.0)
Immature Granulocytes: 0 %
Lymphocytes Relative: 21 %
Lymphs Abs: 1.6 10*3/uL (ref 0.7–4.0)
MCH: 29.2 pg (ref 26.0–34.0)
MCHC: 32.1 g/dL (ref 30.0–36.0)
MCV: 91 fL (ref 80.0–100.0)
Monocytes Absolute: 0.6 10*3/uL (ref 0.1–1.0)
Monocytes Relative: 8 %
NRBC: 0 % (ref 0.0–0.2)
Neutro Abs: 5.3 10*3/uL (ref 1.7–7.7)
Neutrophils Relative %: 70 %
Platelets: 280 10*3/uL (ref 150–400)
RBC: 4.87 MIL/uL (ref 3.87–5.11)
RDW: 13.4 % (ref 11.5–15.5)
WBC: 7.7 10*3/uL (ref 4.0–10.5)

## 2017-12-29 LAB — URINALYSIS, ROUTINE W REFLEX MICROSCOPIC
Glucose, UA: NEGATIVE mg/dL
Hgb urine dipstick: NEGATIVE
Ketones, ur: 15 mg/dL — AB
Nitrite: NEGATIVE
Protein, ur: 30 mg/dL — AB
Specific Gravity, Urine: 1.02 (ref 1.005–1.030)
pH: 6 (ref 5.0–8.0)

## 2017-12-29 LAB — TROPONIN I: Troponin I: <0.03 ng/mL (ref <0.03)

## 2017-12-29 LAB — COMPREHENSIVE METABOLIC PANEL
ALT: 31 U/L (ref 0–44)
AST: 40 U/L (ref 15–41)
Albumin: 4.1 g/dL (ref 3.5–5.0)
Alkaline Phosphatase: 82 U/L (ref 38–126)
Anion gap: 10 (ref 5–15)
BUN: 17 mg/dL (ref 8–23)
CO2: 23 mmol/L (ref 22–32)
Calcium: 9.6 mg/dL (ref 8.9–10.3)
Chloride: 106 mmol/L (ref 98–111)
Creatinine, Ser: 1.13 mg/dL — ABNORMAL HIGH (ref 0.44–1.00)
GFR, EST AFRICAN AMERICAN: 53 mL/min — AB (ref >60)
GFR, EST NON AFRICAN AMERICAN: 46 mL/min — AB (ref >60)
Glucose, Bld: 115 mg/dL — ABNORMAL HIGH (ref 70–99)
Potassium: 3.7 mmol/L (ref 3.5–5.1)
Sodium: 139 mmol/L (ref 135–145)
Total Bilirubin: 0.8 mg/dL (ref 0.3–1.2)
Total Protein: 7.7 g/dL (ref 6.5–8.1)

## 2017-12-29 LAB — I-STAT CG4 LACTIC ACID, ED: Lactic Acid, Venous: 1.46 mmol/L (ref 0.5–1.9)

## 2017-12-29 LAB — LIPASE, BLOOD: Lipase: 42 U/L (ref 11–51)

## 2017-12-29 LAB — URINALYSIS, MICROSCOPIC (REFLEX): RBC / HPF: NONE SEEN RBC/hpf (ref 0–5)

## 2017-12-29 MED ORDER — SODIUM CHLORIDE 0.9 % IV BOLUS
1000.0000 mL | Freq: Once | INTRAVENOUS | Status: AC
  Administered 2017-12-29: 1000 mL via INTRAVENOUS

## 2017-12-29 MED ORDER — PANTOPRAZOLE SODIUM 20 MG PO TBEC: 20.0000 mg | DELAYED_RELEASE_TABLET | Freq: Every day | ORAL | 0 refills | Status: DC

## 2017-12-29 MED ORDER — IOPAMIDOL (ISOVUE-300) INJECTION 61%
100.0000 mL | Freq: Once | INTRAVENOUS | Status: AC | PRN
  Administered 2017-12-29: 100 mL via INTRAVENOUS

## 2017-12-29 NOTE — ED Triage Notes (Signed)
Per pt and husband with n/v x 2 weeks "some diarrhea"-pt with dementia however husband states she did not recognize family ~1030am-lasted ~15 min-NAD-steady gait

## 2017-12-29 NOTE — ED Notes (Signed)
Straight stick x 1 to right hand to obtain lactic

## 2017-12-29 NOTE — ED Notes (Signed)
Urine sample given however it was not enough for a culture

## 2017-12-29 NOTE — ED Provider Notes (Signed)
[facility] EMERGENCY DEPARTMENT Provider Note   CSN: [id number] Arrival date & time: 12/29/17  1254     History   Chief Complaint Chief Complaint  Patient presents with  . Emesis    HPI Chelsea Maddox is a 82 y.o. female.  82 year old female with past medical history including dementia, COPD, hypertension, hyperlipidemia, macular degeneration who presents with vomiting.  Family reports that over the past week or so, she has had intermittent vomiting that only occurs at night.  Last episode of vomiting was early this morning.  She will go a night or 2 without any vomiting but then it will start up again.  She does not have vomiting during the daytime.  She has occasional cough.  She may have had some diarrhea recently.  No fevers.  She has not complained of any pain.  LEVEL 5 CAVEAT DUE TO DEMENTIA  The history is provided by the spouse and a relative.  Emesis      Past Medical History:  Diagnosis Date  . Allergy   . Arthritis   . Chicken pox   . COPD (chronic obstructive pulmonary disease) (HCC)   . Dementia (HCC)   . DJD (degenerative joint disease)   . Generalized headaches   . Hyperlipidemia   . Hypertension   . Hypertensive retinopathy of both eyes   . Kidney disease   . Macular degeneration   . Neuropathy   . Nuclear sclerotic cataract of both eyes   . Osteoarthritis   . Peripheral neuropathy   . Retinal edema   . Thyroid disease   . UTI (urinary tract infection)   . Vitamin D deficiency     Patient Active Problem List   Diagnosis Date Noted  . Hyperlipidemia 11/28/2017  . Anxiety 11/28/2017  . Essential hypertension 11/28/2017  . Degenerative arthritis of knee, bilateral 09/22/2017  . Low back pain syndrome 09/22/2017  . Chronic pain syndrome 09/22/2017  . Dementia (HCC) 09/22/2017    Past Surgical History:  Procedure Laterality Date  . ABDOMINAL HYSTERECTOMY    . SACRAL NERVE STIMULATOR PLACEMENT  2018  . TRIGGER FINGER RELEASE        OB History   No obstetric history on file.      Home Medications    Prior to Admission medications   Medication Sig Start Date End Date Taking? Authorizing Provider  cholecalciferol (VITAMIN D) 1000 units tablet Take 5,000 Units by mouth daily.    [provider]  escitalopram (LEXAPRO) 10 MG tablet Take 1 tablet (10 mg total) by mouth daily. 12/20/17   Sharon SellerEubanks, Jessica K, NP  losartan-hydrochlorothiazide (HYZAAR) 100-12.5 MG tablet Take 1 tablet by mouth daily. 12/20/17   Sharon SellerEubanks, Jessica K, NP  pantoprazole (PROTONIX) 20 MG tablet Take 1 tablet (20 mg total) by mouth daily. 12/29/17   Sheria Rosello, Ambrose Finlandachel Morgan, MD  Resveratrol 100 MG CAPS Take 100 mg by mouth.     [provider]    Family History Family History  Problem Relation Age of Onset  . Arthritis Mother   . Diabetes Mother   . Hearing loss Mother   . Hyperlipidemia Mother   . Hypertension Mother   . Arthritis Father   . COPD Father   . Hearing loss Father   . Heart disease Father   . Hyperlipidemia Father   . Hypertension Father   . Kidney disease Father   . Anuerysm Father        aortic arch  . Dementia Father   .  Arthritis Sister 37  . Hypertension Sister   . Macular degeneration Sister   . Aneurysm Sister        aortic  . Arthritis Sister   . Hearing loss Sister   . Hypertension Sister   . Aneurysm Sister        brain  . Early death Brother 77  . Hyperlipidemia Brother   . Hypertension Brother   . Aneurysm Brother        aortic  . Arthritis Brother   . Hypertension Brother   . Gout Brother   . Macular degeneration Brother   . Aneurysm Brother        aortic  . Other Brother 97       [place]  . Hyperlipidemia Daughter 19  . Hypertension Daughter   . Depression Daughter   . Arthritis Daughter   . Arthritis Daughter   . Hypertension Son     Social History Social History   Tobacco Use  . Smoking status: Never Smoker  . Smokeless tobacco: Never Used  Substance Use  Topics  . Alcohol use: Never    Frequency: Never  . Drug use: Never     Allergies   Patient has no known allergies.   Review of Systems Review of Systems  Unable to perform ROS: Dementia  Gastrointestinal: Positive for vomiting.     Physical Exam Updated Vital Signs BP (!) 145/81 (BP Location: Left Arm)   Pulse (!) 106   Temp (!) 97.3 F (36.3 C) (Oral)   Resp 20   Ht 5\' 5"  (1.651 m)   Wt 72.3 kg   SpO2 99%   BMI 26.54 kg/m   Physical Exam Vitals signs and nursing note reviewed.  Constitutional:      General: She is not in acute distress.    Appearance: She is well-developed.  HENT:     Head: Normocephalic and atraumatic.  Eyes:     Conjunctiva/sclera: Conjunctivae normal.  Neck:     Musculoskeletal: Neck supple.  Cardiovascular:     Rate and Rhythm: Normal rate and regular rhythm.     Heart sounds: Normal heart sounds. No murmur.  Pulmonary:     Effort: Pulmonary effort is normal.     Breath sounds: Normal breath sounds.  Abdominal:     General: Bowel sounds are normal. There is no distension.     Palpations: Abdomen is soft.     Tenderness: There is no abdominal tenderness.  Skin:    General: Skin is warm and dry.  Neurological:     Mental Status: She is alert.     Comments: Fluent speech, oriented to person, following commands      ED Treatments / Results  Labs (all labs ordered are listed, but only abnormal results are displayed) Labs Reviewed  COMPREHENSIVE METABOLIC PANEL - Abnormal; Notable for the following components:      Result Value   Glucose, Bld 115 (*)    Creatinine, Ser 1.13 (*)    GFR calc non Af Amer 46 (*)    GFR calc Af Amer 53 (*)    All other components within normal limits  URINALYSIS, ROUTINE W REFLEX MICROSCOPIC - Abnormal; Notable for the following components:   Bilirubin Urine SMALL (*)    Ketones, ur 15 (*)    Protein, ur 30 (*)    Leukocytes, UA TRACE (*)    All other components within normal limits    URINALYSIS, MICROSCOPIC (REFLEX) - Abnormal; Notable for  the following components:   Bacteria, UA MANY (*)    All other components within normal limits  LIPASE, BLOOD  CBC WITH DIFFERENTIAL/PLATELET  TROPONIN I  I-STAT CG4 LACTIC ACID, ED  I-STAT CG4 LACTIC ACID, ED    EKG EKG Interpretation  Date/Time:  Wednesday December 29 2017 13:22:24 EST Ventricular Rate:  70 PR Interval:    QRS Duration: 123 QT Interval:  410 QTC Calculation: 443 R Axis:   -23 Text Interpretation:  Sinus rhythm Right bundle branch block Inferior infarct, old Probable anterolateral infarct, old Baseline wander in lead(s) V6 No previous ECGs available Confirmed by Frederick Peers [phone]) on 12/29/2017 1:50:27 PM Also confirmed by Frederick Peers [phone]), editor Barbette Hair [phone])  on 12/29/2017 2:29:23 PM   Radiology Dg Chest 2 View  Result Date: 12/29/2017 CLINICAL DATA:  Nausea and vomiting x2 weeks with some diarrhea. EXAM: CHEST - 2 VIEW COMPARISON:  None. FINDINGS: The heart size and mediastinal contours are within normal limits. Mild aortic atherosclerosis noted at the arch with uncoiling of the thoracic aorta. No aneurysm is identified. Both lungs are clear. Mild thoracic spondylosis with multilevel degenerative disc flattening. No acute osseous appearing abnormality. IMPRESSION: No active cardiopulmonary disease. Electronically Signed   By: Tollie Eth M.D.   On: 12/29/2017 15:23   Ct Abdomen Pelvis W Contrast  Result Date: 12/29/2017 CLINICAL DATA:  Nausea and vomiting for 2 weeks. EXAM: CT ABDOMEN AND PELVIS WITH CONTRAST TECHNIQUE: Multidetector CT imaging of the abdomen and pelvis was performed using the standard protocol following bolus administration of intravenous contrast. CONTRAST:  ISOVUE-300 IOPAMIDOL (ISOVUE-300) INJECTION 61% COMPARISON:  None. FINDINGS: Lower chest: Normal heart size with coronary arteriosclerosis. No pericardial effusion or thickening. Clear lung bases without  effusion or pneumothorax. Hepatobiliary: Physiologic distention of the gallbladder without stones. No secondary signs of acute cholecystitis. Steatosis of the liver without mass. Pancreas: Normal Spleen: Normal Adrenals/Urinary Tract: Normal bilateral adrenal glands. Bilateral parapelvic renal cysts are noted without renal mass, nephrolithiasis nor obstructive uropathy. Minimal areas cortical thinning or scarring is seen in the upper and lower poles of the right kidney and interpolar left kidney. No hydroureter. The urinary bladder is unremarkable for the degree of distention. Stomach/Bowel: Stomach is within normal limits. Appendix appears normal. No evidence of bowel wall thickening, distention, or inflammatory changes. There is scattered colonic diverticulosis without acute diverticulitis. Vascular/Lymphatic: Moderate aortoiliac atherosclerosis without aneurysm. No adenopathy. Reproductive: Status post hysterectomy. No adnexal masses. Other: Small fat containing periumbilical hernia. No abdominopelvic ascites. Musculoskeletal: Neural stimulator device projects over the left buttock with leads extending into the spinal canal to the L3-4 level. Thoracic spondylosis with multilevel degenerative disc disease. Lesser degree of degenerative disc disease at L5-S1. Facet arthrosis is noted of the lumbar spine. IMPRESSION: 1. Hepatic steatosis. 2. Bilateral parapelvic renal cysts without obstructive uropathy. 3. No acute bowel inflammation or obstruction. Colonic diverticulosis without acute diverticulitis. Aortic Atherosclerosis (ICD10-I70.0). Electronically Signed   By: Tollie Eth M.D.   On: 12/29/2017 15:29    Procedures Procedures (including critical care time)  Medications Ordered in ED Medications  sodium chloride 0.9 % bolus 1,000 mL (1,000 mLs Intravenous New Bag/Given 12/29/17 1431)  iopamidol (ISOVUE-300) 61 % injection 100 mL (100 mLs Intravenous Contrast Given 12/29/17 1448)     Initial  Impression / Assessment and Plan / ED Course  I have reviewed the triage vital signs and the nursing notes.  Pertinent labs & imaging results that were available during my care of the  patient were reviewed by me and considered in my medical decision making (see chart for details).    She was comfortable on exam with reassuring vital signs, no abdominal tenderness.  Lab work shows normal lactate, UA without evidence of infection, reassuring LFTs and lipase.  Creatinine 1.13.  CBC.  Because of the patient's dementia and inability to explain her symptoms, obtain CT of abdomen and pelvis to rule out obstruction, diverticulitis, volvulus, hiatal hernia, or other acute intra-abdominal process.  CT was reassuring.  Chest x-ray clear.  Because her symptoms seem to occur at night after she has laid down, I discussed the possibility of reflux.  Recommended a trial of PPI to see if it improves her symptoms and close follow-up with PCP for reassessment.  She has had no vomiting throughout the day today and has remained well here.  Family feels comfortable with this plan.  Discussed GERD diet and recommended eating dinner several hours before bedtime.  Extensively reviewed return precautions and family voiced understanding.  Final Clinical Impressions(s) / ED Diagnoses   Final diagnoses:  Intermittent vomiting    ED Discharge Orders         Ordered    pantoprazole (PROTONIX) 20 MG tablet  Daily     12/29/17 1544           Kasia Trego, Ambrose Finland, MD 12/29/17 1555

## 2017-12-29 NOTE — ED Notes (Signed)
Patient transported to CT 

## 2018-01-10 ENCOUNTER — Encounter: Payer: Self-pay | Admitting: Nurse Practitioner

## 2018-01-10 ENCOUNTER — Ambulatory Visit (INDEPENDENT_AMBULATORY_CARE_PROVIDER_SITE_OTHER): Payer: Medicare Other | Admitting: Nurse Practitioner

## 2018-01-10 VITALS — BP 132/78 | HR 69 | Ht 64.0 in | Wt 158.0 lb

## 2018-01-10 DIAGNOSIS — K219 Gastro-esophageal reflux disease without esophagitis: Secondary | ICD-10-CM

## 2018-01-10 MED ORDER — PANTOPRAZOLE SODIUM 20 MG PO TBEC: 20.0000 mg | DELAYED_RELEASE_TABLET | Freq: Every day | ORAL | 1 refills | Status: DC

## 2018-01-10 NOTE — Progress Notes (Signed)
Careteam: Patient Care Team: Sharon Seller, NP as PCP - General (Geriatric Medicine) Judi Saa, DO as Consulting Physician (Family Medicine) Eber Jones, MD as Referring Physician (Ophthalmology)  Advanced Directive information Does Patient Have a Medical Advance Directive?: No  No Known Allergies  Chief Complaint  Patient presents with  . Medical Management of Chronic Issues    ED Follow up no vomiting,diarrhea inlast 4 days      HPI: Patient is a 82 y.o. female seen in the office today for ED follow up.  Starting after episodes of vomiting and diarrhea in the middle of the night, would happen randomly but then starting occurring daily and so they went to the ED. CT abdomen without acute issues, labs unremarkable.  The last episode was 01/06/18 and they have modified her diet which has helped and started Protonix.  Continues to belch.  No abdominal pain.  Wt Readings from Last 3 Encounters:  01/10/18 158 lb (71.7 kg)  12/29/17 159 lb 8 oz (72.3 kg)  12/20/17 163 lb 3.2 oz (74 kg)    Review of Systems:  Review of Systems  Unable to perform ROS: Dementia    Past Medical History:  Diagnosis Date  . Allergy   . Arthritis   . Chicken pox   . COPD (chronic obstructive pulmonary disease) (HCC)   . Dementia (HCC)   . DJD (degenerative joint disease)   . Generalized headaches   . Hyperlipidemia   . Hypertension   . Hypertensive retinopathy of both eyes   . Kidney disease   . Macular degeneration   . Neuropathy   . Nuclear sclerotic cataract of both eyes   . Osteoarthritis   . Peripheral neuropathy   . Retinal edema   . Thyroid disease   . UTI (urinary tract infection)   . Vitamin D deficiency    Past Surgical History:  Procedure Laterality Date  . ABDOMINAL HYSTERECTOMY    . SACRAL NERVE STIMULATOR PLACEMENT  2018  . TRIGGER FINGER RELEASE     Social History:   reports that she has never smoked. She has never used smokeless tobacco. She  reports that she does not drink alcohol or use drugs.  Family History  Problem Relation Age of Onset  . Arthritis Mother   . Diabetes Mother   . Hearing loss Mother   . Hyperlipidemia Mother   . Hypertension Mother   . Arthritis Father   . COPD Father   . Hearing loss Father   . Heart disease Father   . Hyperlipidemia Father   . Hypertension Father   . Kidney disease Father   . Anuerysm Father        aortic arch  . Dementia Father   . Arthritis Sister 60  . Hypertension Sister   . Macular degeneration Sister   . Aneurysm Sister        aortic  . Arthritis Sister   . Hearing loss Sister   . Hypertension Sister   . Aneurysm Sister        brain  . Early death Brother 5  . Hyperlipidemia Brother   . Hypertension Brother   . Aneurysm Brother        aortic  . Arthritis Brother   . Hypertension Brother   . Gout Brother   . Macular degeneration Brother   . Aneurysm Brother        aortic  . Other Brother 28  [place]  . Hyperlipidemia Daughter [id number]  . Hypertension Daughter   . Depression Daughter   . Arthritis Daughter   . Arthritis Daughter   . Hypertension Son     Medications: Patient's Medications  New Prescriptions   No medications on file  Previous Medications   CHOLECALCIFEROL (VITAMIN D) 1000 UNITS TABLET    Take 5,000 Units by mouth daily.   ESCITALOPRAM (LEXAPRO) 10 MG TABLET    Take 1 tablet (10 mg total) by mouth daily.   LOSARTAN-HYDROCHLOROTHIAZIDE (HYZAAR) 100-12.5 MG TABLET    Take 1 tablet by mouth daily.   PANTOPRAZOLE (PROTONIX) 20 MG TABLET    Take 1 tablet (20 mg total) by mouth daily.   RESVERATROL 100 MG CAPS    Take 100 mg by mouth.   Modified Medications   No medications on file  Discontinued Medications   No medications on file     Physical Exam:  Vitals:   01/10/18 1541  BP: 132/78  Pulse: 69  SpO2: 97%  Weight: 158 lb (71.7 kg)  Height: 5\' 4"  (1.626 m)   Body mass index is 27.12 kg/m.  Physical  Exam Constitutional:      General: She is not in acute distress.    Appearance: She is well-developed. She is not diaphoretic.  HENT:     Head: Normocephalic and atraumatic.     Mouth/Throat:     Pharynx: No oropharyngeal exudate.  Eyes:     Conjunctiva/sclera: Conjunctivae normal.     Pupils: Pupils are equal, round, and reactive to light.  Neck:     Musculoskeletal: Normal range of motion and neck supple.  Cardiovascular:     Rate and Rhythm: Normal rate and regular rhythm.     Heart sounds: Normal heart sounds.  Pulmonary:     Effort: Pulmonary effort is normal.     Breath sounds: Normal breath sounds.  Abdominal:     General: Bowel sounds are normal.     Palpations: Abdomen is soft.  Musculoskeletal:        General: No tenderness.  Skin:    General: Skin is warm and dry.  Neurological:     Mental Status: She is alert.  Psychiatric:        Cognition and Memory: Memory is impaired. She exhibits impaired recent memory.    Labs reviewed: Basic Metabolic Panel: Recent Labs    09/22/17 1526 12/29/17 1318  NA 143 139  K 4.0 3.7  CL 105 106  CO2 29 23  GLUCOSE 91 115*  BUN 31* 17  CREATININE 1.09 1.13*  CALCIUM 10.1  9.6 9.6  TSH 2.02  --    Liver Function Tests: Recent Labs    09/22/17 1526 12/29/17 1318  AST 17 40  ALT 17 31  ALKPHOS 84 82  BILITOT 0.6 0.8  PROT 7.6 7.7  ALBUMIN 4.3 4.1   Recent Labs    12/29/17 1318  LIPASE 42   No results for input(s): AMMONIA in the last 8760 hours. CBC: Recent Labs    09/22/17 1526 12/29/17 1318  WBC 6.3 7.7  NEUTROABS 4.1 5.3  HGB 13.9 14.2  HCT 41.6 44.3  MCV 88.0 91.0  PLT 246.0 280   Lipid Panel: Recent Labs    11/25/17 1020  CHOL 267*  HDL 65  LDLCALC 179*  TRIG 107  CHOLHDL 4.1   TSH: Recent Labs    09/22/17 1526  TSH 2.02   A1C: No results found for: HGBA1C  Assessment/Plan 1. Gastroesophageal reflux disease without esophagitis Overall has had improvement in the last few  days, will continue current regimen and monitor at this time. May need to increase dose if symptoms persist.  - pantoprazole (PROTONIX) 20 MG tablet; Take 1 tablet (20 mg total) by mouth daily.  Dispense: 30 tablet; Refill: 1  Next appt: as schedule, sooner if needed Saharra Santo K. Biagio BorgEubanks, AGNP  [facility] [phone]

## 2018-01-18 NOTE — Progress Notes (Signed)
[name] [address] [place], [place] [address] Phone: [phone] Subjective:   I, Chelsea Maddox, am serving as a scribe for Dr. Antoine Primas.     CC: ` Bilateral knee pain  NOM:[id number]  Chelsea Maddox is a 82 y.o. female coming in with complaint of bilateral knee pain. Sitting makes her pain better. Back is doing well.  Known arthritic changes.  3 months ago given Visco supplementation.  Helped for approximately 6 weeks now last 6 weeks started increasing discomfort again.  Patient has noticed some more swelling.  Not as active.  Decreasing activity overall.  Starting to cause more discomfort in her back as well.  Seen another provider for her back in the future.      Past Medical History:  Diagnosis Date  . Allergy   . Arthritis   . Chicken pox   . COPD (chronic obstructive pulmonary disease) (HCC)   . Dementia (HCC)   . DJD (degenerative joint disease)   . Generalized headaches   . Hyperlipidemia   . Hypertension   . Hypertensive retinopathy of both eyes   . Kidney disease   . Macular degeneration   . Neuropathy   . Nuclear sclerotic cataract of both eyes   . Osteoarthritis   . Peripheral neuropathy   . Retinal edema   . Thyroid disease   . UTI (urinary tract infection)   . Vitamin D deficiency    Past Surgical History:  Procedure Laterality Date  . ABDOMINAL HYSTERECTOMY    . SACRAL NERVE STIMULATOR PLACEMENT  2018  . TRIGGER FINGER RELEASE     Social History   Socioeconomic History  . Marital status: Married    Spouse name: Not on file  . Number of children: Not on file  . Years of education: Not on file  . Highest education level: Not on file  Occupational History  . Not on file  Social Needs  . Financial resource strain: Not on file  . Food insecurity:    Worry: Not on file    Inability: Not on file  . Transportation needs:    Medical: Not on file    Non-medical: Not on file  Tobacco Use  . Smoking status:  Never Smoker  . Smokeless tobacco: Never Used  Substance and Sexual Activity  . Alcohol use: Never    Frequency: Never  . Drug use: Never  . Sexual activity: Not Currently  Lifestyle  . Physical activity:    Days per week: Not on file    Minutes per session: Not on file  . Stress: Not on file  Relationships  . Social connections:    Talks on phone: Not on file    Gets together: Not on file    Attends religious service: Not on file    Active member of club or organization: Not on file    Attends meetings of clubs or organizations: Not on file    Relationship status: Not on file  Other Topics Concern  . Not on file  Social History Narrative   Social History      Diet?       Do you drink/eat things with caffeine? yes      Marital status?        married                            What year were you married? 64  Do you live in a house, apartment, assisted living, condo, trailer, etc.? house      Is it one or more stories? 1      How many persons live in your home? 4      Do you have any pets in your home? (please list) 3 dogs, 1 cat      Highest level of education completed? Some college      Current or past profession: homemaker      Do you exercise?       no                               Type & how often?      Advanced Directives      Do you have a living will? yes      Do you have a DNR form?                                  If not, do you want to discuss one? no      Do you have signed POA/HPOA for forms? yes      Functional Status      Do you have difficulty bathing or dressing yourself? yes      Do you have difficulty preparing food or eating?  yes      Do you have difficulty managing your medications? yes      Do you have difficulty managing your finances? yes      Do you have difficulty affording your medications? no   No Known Allergies Family History  Problem Relation Age of Onset  . Arthritis Mother   . Diabetes Mother   . Hearing loss  Mother   . Hyperlipidemia Mother   . Hypertension Mother   . Arthritis Father   . COPD Father   . Hearing loss Father   . Heart disease Father   . Hyperlipidemia Father   . Hypertension Father   . Kidney disease Father   . Anuerysm Father        aortic arch  . Dementia Father   . Arthritis Sister 34  . Hypertension Sister   . Macular degeneration Sister   . Aneurysm Sister        aortic  . Arthritis Sister   . Hearing loss Sister   . Hypertension Sister   . Aneurysm Sister        brain  . Early death Brother 40  . Hyperlipidemia Brother   . Hypertension Brother   . Aneurysm Brother        aortic  . Arthritis Brother   . Hypertension Brother   . Gout Brother   . Macular degeneration Brother   . Aneurysm Brother        aortic  . Other Brother 19       [place]  . Hyperlipidemia Daughter 24  . Hypertension Daughter   . Depression Daughter   . Arthritis Daughter   . Arthritis Daughter   . Hypertension Son      Current Outpatient Medications (Cardiovascular):  .  losartan-hydrochlorothiazide (HYZAAR) 100-12.5 MG tablet, Take 1 tablet by mouth daily.     Current Outpatient Medications (Other):  .  cholecalciferol (VITAMIN D) 1000 units tablet, Take 5,000 Units by mouth daily. Marland Kitchen  escitalopram (LEXAPRO) 10 MG tablet, Take 1 tablet (10  mg total) by mouth daily. .  pantoprazole (PROTONIX) 20 MG tablet, Take 1 tablet (20 mg total) by mouth daily. Marland Kitchen.  Resveratrol 100 MG CAPS, Take 100 mg by mouth.     Past medical history, social, surgical and family history all reviewed in electronic medical record.  No pertanent information unless stated regarding to the chief complaint.   Review of Systems:  No headache, visual changes, nausea, vomiting, diarrhea, constipation, dizziness, abdominal pain, skin rash, fevers, chills, night sweats, weight loss, swollen lymph nodes, body aches, joint swelling, muscle aches, chest pain, shortness of breath, mood changes.   Objective   Blood pressure 110/78, pulse 67, height 5\' 4"  (1.626 m), weight 147 lb (66.7 kg), SpO2 98 %.     Knee: Bilateral valgus deformity noted. Large thigh to calf ratio.  Tender to palpation over medial and PF joint line.  ROM full in flexion and extension and lower leg rotation. instability with valgus force.  painful patellar compression. Patellar glide with moderate crepitus. Patellar and quadriceps tendons unremarkable. Hamstring and quadriceps strength is normal.  After informed written and verbal consent, patient was seated on exam table. Right knee was prepped with alcohol swab and utilizing anterolateral approach, patient's right knee space was injected with 4:1  marcaine 0.5%: Kenalog 40mg /dL. Patient tolerated the procedure well without immediate complications.  After informed written and verbal consent, patient was seated on exam table. Left knee was prepped with alcohol swab and utilizing anterolateral approach, patient's left knee space was injected with 4:1  marcaine 0.5%: Kenalog 40mg /dL. Patient tolerated the procedure well without immediate complications.   Impression and Recommendations:      The above documentation has been reviewed and is accurate and complete Judi SaaZachary M Darrio Bade, DO       Note: This dictation was prepared with Dragon dictation along with smaller phrase technology. Any transcriptional errors that result from this process are unintentional.

## 2018-01-19 ENCOUNTER — Encounter: Payer: Self-pay | Admitting: Family Medicine

## 2018-01-19 ENCOUNTER — Ambulatory Visit (INDEPENDENT_AMBULATORY_CARE_PROVIDER_SITE_OTHER): Payer: Medicare HMO | Admitting: Family Medicine

## 2018-01-19 DIAGNOSIS — M545 Low back pain, unspecified: Secondary | ICD-10-CM

## 2018-01-19 DIAGNOSIS — G8929 Other chronic pain: Secondary | ICD-10-CM | POA: Diagnosis not present

## 2018-01-19 DIAGNOSIS — M170 Bilateral primary osteoarthritis of knee: Secondary | ICD-10-CM

## 2018-01-19 NOTE — Patient Instructions (Signed)
ood to see you  Ice is your friend Injected the knees again  Will get approval again for monovisc and will be able to do again on April 8th, make an appointment then  Read about Odette Fraction if you want for you back  Also consider PT for posture Happy New Year!

## 2018-01-19 NOTE — Assessment & Plan Note (Signed)
Has nerve stimulator.  Is going to see another provider as well.

## 2018-01-19 NOTE — Assessment & Plan Note (Signed)
Bilateral injections given today.  Discussed icing regimen and home exercise.  Discussed which activities to do which wants to avoid.  Patient is to increase activity slowly over the course of next several days.  Patient could be getting viscosupplementation again in 3 months.  Patient will follow-up at that time.  Patient wants to avoid any surgical intervention.

## 2018-02-03 ENCOUNTER — Telehealth: Payer: Self-pay

## 2018-02-03 DIAGNOSIS — G4734 Idiopathic sleep related nonobstructive alveolar hypoventilation: Secondary | ICD-10-CM

## 2018-02-03 DIAGNOSIS — J449 Chronic obstructive pulmonary disease, unspecified: Secondary | ICD-10-CM | POA: Diagnosis not present

## 2018-02-03 NOTE — Telephone Encounter (Signed)
Incoming fax received from [employer] (Pulse Oximetry Summary report). Per report patient qualifies for oxygen.  I called [facility] to inquire about getting patient set up for oxygen.  1.) Submit Oxygen RX that indicates liter flow 2.) Last OV note discussing respiratory issues  Fax to [phone]

## 2018-02-04 NOTE — Telephone Encounter (Signed)
Order faxed. Left message on voicemail for patient to return call when available, reason for call: inform patient order faxed and why (qualifies for night oxygen)   S.Chrae B/CMA

## 2018-02-04 NOTE — Telephone Encounter (Signed)
Will need 2L o2, at bedtime, I documented the need in our first OV

## 2018-02-04 NOTE — Telephone Encounter (Signed)
Please complete pending order, there is an unanswered question outside of my scope.   Thanks

## 2018-02-04 NOTE — Telephone Encounter (Signed)
Completed, sorry I did not see the orders that were attached.

## 2018-02-08 NOTE — Telephone Encounter (Signed)
Daughter, Dickey Gave back and stated that she spoke with Efraim Kaufmann at [facility] and she stated that patient still needed documentation faxed to them that patient uses a Nebulizer. Daughter stated that patient does use a Nebulizer when her Bronchitis. Needs documentation faxed to [facility]. Please advise.

## 2018-02-08 NOTE — Telephone Encounter (Signed)
I was unaware she used a nebulizer, she does not have a diagnosis that will support that for long term use.

## 2018-02-08 NOTE — Telephone Encounter (Signed)
Patient daughter returned Anita's call. I informed Chelsea Maddox of Jessica's response to the nebulizer.Chelsea Maddox stated she forgot to mention it to [facility] at the last appointment and that she would wait until the March appointment to discuss.

## 2018-02-08 NOTE — Telephone Encounter (Signed)
Opened in error

## 2018-02-08 NOTE — Telephone Encounter (Signed)
Chelsea Maddox, daughter was returning Chrae's call. Message given to daughter. Agreed.

## 2018-02-08 NOTE — Telephone Encounter (Signed)
LMOM to return call.

## 2018-02-10 ENCOUNTER — Encounter: Payer: Self-pay | Admitting: Nurse Practitioner

## 2018-02-15 ENCOUNTER — Other Ambulatory Visit: Payer: Self-pay | Admitting: *Deleted

## 2018-02-15 MED ORDER — PANTOPRAZOLE SODIUM 40 MG PO TBEC: 40.0000 mg | DELAYED_RELEASE_TABLET | Freq: Every day | ORAL | 0 refills | Status: DC

## 2018-02-15 NOTE — Telephone Encounter (Signed)
Patient daughter requested refill. Faxed 

## 2018-02-21 ENCOUNTER — Ambulatory Visit
Admission: RE | Admit: 2018-02-21 | Discharge: 2018-02-21 | Disposition: A | Discharge status: Home / Self Care | Payer: Medicare Other | Source: Ambulatory Visit | Attending: Nurse Practitioner | Admitting: Nurse Practitioner

## 2018-02-21 ENCOUNTER — Ambulatory Visit
Admission: RE | Admit: 2018-02-21 | Discharge: 2018-02-21 | Disposition: A | Discharge status: Home / Self Care | Payer: Medicare HMO | Source: Ambulatory Visit | Attending: Nurse Practitioner | Admitting: Nurse Practitioner

## 2018-02-21 DIAGNOSIS — M810 Age-related osteoporosis without current pathological fracture: Secondary | ICD-10-CM | POA: Diagnosis not present

## 2018-02-21 DIAGNOSIS — Z780 Asymptomatic menopausal state: Secondary | ICD-10-CM | POA: Diagnosis not present

## 2018-02-21 DIAGNOSIS — M85851 Other specified disorders of bone density and structure, right thigh: Secondary | ICD-10-CM | POA: Diagnosis not present

## 2018-02-21 DIAGNOSIS — E2839 Other primary ovarian failure: Secondary | ICD-10-CM

## 2018-02-21 DIAGNOSIS — Z1231 Encounter for screening mammogram for malignant neoplasm of breast: Secondary | ICD-10-CM | POA: Diagnosis not present

## 2018-02-24 ENCOUNTER — Telehealth: Payer: Self-pay | Admitting: *Deleted

## 2018-02-24 NOTE — Telephone Encounter (Signed)
Received Prior Authorization request for Prolia from [employer] [id number] [phone]. Filled out and given back to [place] for process.

## 2018-02-25 NOTE — Telephone Encounter (Addendum)
Received fax from [employer] stating Prolia is APPROVED. Placed approval on Lisa's desk.   ID#: Hermenia Bers #:[id number]

## 2018-03-03 DIAGNOSIS — G301 Alzheimer's disease with late onset: Secondary | ICD-10-CM | POA: Diagnosis not present

## 2018-03-03 DIAGNOSIS — R69 Illness, unspecified: Secondary | ICD-10-CM | POA: Diagnosis not present

## 2018-03-30 ENCOUNTER — Encounter: Payer: Medicare Other | Admitting: Nurse Practitioner

## 2018-03-30 ENCOUNTER — Other Ambulatory Visit: Payer: Self-pay

## 2018-03-30 ENCOUNTER — Ambulatory Visit (INDEPENDENT_AMBULATORY_CARE_PROVIDER_SITE_OTHER): Payer: Medicare HMO | Admitting: Nurse Practitioner

## 2018-03-30 ENCOUNTER — Encounter: Payer: Self-pay | Admitting: Nurse Practitioner

## 2018-03-30 VITALS — BP 134/70 | HR 71 | Temp 97.7°F | Ht 64.0 in | Wt 148.2 lb

## 2018-03-30 DIAGNOSIS — J302 Other seasonal allergic rhinitis: Secondary | ICD-10-CM

## 2018-03-30 DIAGNOSIS — E785 Hyperlipidemia, unspecified: Secondary | ICD-10-CM

## 2018-03-30 DIAGNOSIS — K219 Gastro-esophageal reflux disease without esophagitis: Secondary | ICD-10-CM

## 2018-03-30 DIAGNOSIS — R69 Illness, unspecified: Secondary | ICD-10-CM | POA: Diagnosis not present

## 2018-03-30 DIAGNOSIS — N183 Chronic kidney disease, stage 3 unspecified: Secondary | ICD-10-CM

## 2018-03-30 DIAGNOSIS — G4734 Idiopathic sleep related nonobstructive alveolar hypoventilation: Secondary | ICD-10-CM | POA: Diagnosis not present

## 2018-03-30 DIAGNOSIS — M810 Age-related osteoporosis without current pathological fracture: Secondary | ICD-10-CM

## 2018-03-30 DIAGNOSIS — I10 Essential (primary) hypertension: Secondary | ICD-10-CM

## 2018-03-30 DIAGNOSIS — F0390 Unspecified dementia without behavioral disturbance: Secondary | ICD-10-CM

## 2018-03-30 MED ORDER — ALBUTEROL SULFATE HFA 108 (90 BASE) MCG/ACT IN AERS: 1.0000 | INHALATION_SPRAY | Freq: Four times a day (QID) | RESPIRATORY_TRACT | 2 refills | Status: AC | PRN

## 2018-03-30 NOTE — Progress Notes (Signed)
Careteam: Patient Care Team: Sharon Seller, NP as PCP - General (Geriatric Medicine) Judi Saa, DO as Consulting Physician (Family Medicine) Eber Jones, MD as Referring Physician (Ophthalmology)  Advanced Directive information    No Known Allergies  Chief Complaint  Patient presents with  . Medical Management of Chronic Issues    Patient returns to the office today for follow up and possible physical. She is taking  supplements for focus, immunity and concentration. Bone density, oxygen at night and Memory will be today's topics.  She has been seeing an eye doctor.     HPI: Patient is a 83 y.o. female seen in the office today   Seasonal allergies/cough - Pt uses albuterol inhaler during her allergic flare ups.  She used albuterol last spring.  nocturnal hypoxia- sleep study reveal o2 at 77 during sleep.  Pt uses oxygen during night. She is on  2L O2. Oxygen was prescribed based on sleep study results. She has tried albuterol in the past without improvement in O2 sats. Uses albuterol occasional during allergy season.  GERD- Pt had only two times upset stomach since her ED visit. Pt denies any heartburn, nausea or diarrhea. Continues on protonix 40 mg by mouth daily, daughter would like her to titrate off.   Weight lost- Pt's daughter tries to provide healthy meals for the patient. Pt is encouraged by daughter to limits sodas, less fast food and eating out, increased vegetable intake, and low sodium intake  Hypertension- stable blood pressure , Pt takes Hyzaar 100-12.5mg  tab Neuropathy- decreased sensitivity in both feet. Pt has a nerve stimulator in the back, that helped with pain during the night. Pt is able to sleep much better now.   Osteoarthritis - pain in both knees, and back. Pt sees Dr. Katrinka Blazing.  And had steroid injection in January 8th. Pt uses OTC cream or Tylenol for pain at home  Dementia- Pt's memory got slightly worse after her hospital admission in  December. Pt does slightly better nowadays but not back to her baseline. Went to [facility] specialist and dx her with advanced dementia, did not wish to do further testing or imagining. They reviewed her head CTs that she has had done in the past. She has been on aricept and namenda and daughter did not wish to restart. Family was referred to [facility].   Hyperlipidemia - pt's daughter does not want to start any medication for it. She plans to manage it with diet and exercise. [facility], and uses treadmill or rides bike once a week for 30-60 minutes.  Vitamin D defficiency- pt continues to take Cholecalciferol 5000 units tablet.      Review of Systems:  Review of Systems  Constitutional: Positive for weight loss. Negative for chills and fever.  HENT: Negative for congestion, ear discharge, ear pain, sinus pain and sore throat.   Eyes: Negative for pain.  Respiratory: Positive for cough. Negative for shortness of breath and wheezing.   Cardiovascular: Negative for chest pain, palpitations and leg swelling.  Gastrointestinal: Negative for abdominal pain, constipation, diarrhea, heartburn, nausea and vomiting.  Genitourinary: Negative for hematuria.  Musculoskeletal: Positive for back pain and joint pain.  Skin: Negative for itching and rash.  Neurological: Positive for tingling.  Endo/Heme/Allergies: Positive for environmental allergies.    Past Medical History:  Diagnosis Date  . Allergy   . Arthritis   . Chicken pox   . COPD (chronic obstructive pulmonary disease) (  HCC)   . Dementia (HCC)   . Dementia without behavioral disturbance (HCC)    unspecified type  . DJD (degenerative joint disease)   . Generalized headaches   . Hyperlipidemia   . Hypertension   . Hypertensive retinopathy of both eyes   . Kidney disease   . Low back pain   . Macular degeneration   . Major depressive disorder   . Neuropathy   . Nuclear sclerotic cataract of both eyes    . Osteoarthritis   . Peripheral neuropathy   . Retinal edema   . Thyroid disease   . UTI (urinary tract infection)   . Vertigo   . Vitamin D deficiency    Past Surgical History:  Procedure Laterality Date  . ABDOMINAL HYSTERECTOMY    . COLONOSCOPY  2012  . SACRAL NERVE STIMULATOR PLACEMENT  2018  . TRIGGER FINGER RELEASE     Social History:   reports that she has never smoked. She has never used smokeless tobacco. She reports that she does not drink alcohol or use drugs.  Family History  Problem Relation Age of Onset  . Arthritis Mother   . Diabetes Mother   . Hearing loss Mother   . Hyperlipidemia Mother   . Hypertension Mother   . Aneurysm Mother   . Arthritis Father   . COPD Father   . Hearing loss Father   . Heart disease Father   . Hyperlipidemia Father   . Hypertension Father   . Kidney disease Father   . Anuerysm Father        aortic arch  . Dementia Father   . Arthritis Sister 21  . Hypertension Sister   . Macular degeneration Sister   . Aneurysm Sister        aortic  . Arthritis Sister   . Hearing loss Sister   . Hypertension Sister   . Aneurysm Sister        brain  . Early death Brother 59  . Hyperlipidemia Brother   . Hypertension Brother   . Aneurysm Brother        aortic  . Arthritis Brother   . Hypertension Brother   . Gout Brother   . Macular degeneration Brother   . Aneurysm Brother        aortic  . Other Brother 52       [place]  . Hyperlipidemia Daughter 15  . Hypertension Daughter   . Depression Daughter   . Arthritis Daughter   . Arthritis Daughter   . Hypertension Son     Medications: Patient's Medications  New Prescriptions   No medications on file  Previous Medications   CHOLECALCIFEROL (VITAMIN D) 1000 UNITS TABLET    Take 5,000 Units by mouth daily.   ESCITALOPRAM (LEXAPRO) 10 MG TABLET    Take 1 tablet (10 mg total) by mouth daily.   LOSARTAN-HYDROCHLOROTHIAZIDE (HYZAAR) 100-12.5 MG TABLET    Take 1 tablet by  mouth daily.   MELATONIN 5 MG CAPS    Take 1 capsule by mouth at bedtime.   MISC NATURAL PRODUCTS (TART CHERRY ADVANCED PO)    Take 1 capsule by mouth daily.   MULTIPLE VITAMINS-MINERALS (PRESERVISION AREDS PO)    Take 1 capsule by mouth 2 (two) times daily.   PANTOPRAZOLE (PROTONIX) 40 MG TABLET    Take 1 tablet (40 mg total) by mouth daily.   RESVERATROL 100 MG CAPS    Take 100 mg by mouth.   Modified Medications  No medications on file  Discontinued Medications   No medications on file     Physical Exam:  Vitals:   03/30/18 1135  BP: 134/70  Pulse: 71  Temp: 97.7 F (36.5 C)  SpO2: 98%  Weight: 148 lb 3.2 oz (67.2 kg)  Height: 5\' 4"  (1.626 m)   Body mass index is 25.44 kg/m.  Physical Exam Vitals signs reviewed.  Constitutional:      Appearance: Normal appearance.  HENT:     Head: Normocephalic and atraumatic.     Right Ear: Tympanic membrane, ear canal and external ear normal.     Left Ear: Tympanic membrane, ear canal and external ear normal.     Nose: No congestion or rhinorrhea.     Mouth/Throat:     Mouth: Mucous membranes are moist.     Pharynx: Oropharynx is clear.  Eyes:     General:        Right eye: No discharge.        Left eye: No discharge.     Extraocular Movements: Extraocular movements intact.     Pupils: Pupils are equal, round, and reactive to light.  Cardiovascular:     Rate and Rhythm: Normal rate and regular rhythm.     Pulses: Normal pulses.     Heart sounds: Normal heart sounds.  Pulmonary:     Effort: Pulmonary effort is normal. No respiratory distress.     Breath sounds: Normal breath sounds. No wheezing or rales.  Abdominal:     General: Bowel sounds are normal.     Palpations: Abdomen is soft.     Tenderness: There is no abdominal tenderness.  Musculoskeletal:     Right lower leg: No edema.     Left lower leg: No edema.  Skin:    General: Skin is warm and dry.  Neurological:     Mental Status: She is alert. Mental status is  at baseline.  Psychiatric:        Mood and Affect: Mood normal.     Labs reviewed: Basic Metabolic Panel: Recent Labs    07/30/17 09/22/17 1526 12/29/17 1318  NA 144 143 139  K 4.0 4.0 3.7  CL  --  105 106  CO2  --  29 23  GLUCOSE  --  91 115*  BUN 13 31* 17  CREATININE 0.9 1.09 1.13*  CALCIUM  --  10.1  9.6 9.6  TSH  --  2.02  --    Liver Function Tests: Recent Labs    06/03/17 09/22/17 1526 12/29/17 1318  AST 25 17 40  ALT 31 17 31   ALKPHOS 113 84 82  BILITOT  --  0.6 0.8  PROT  --  7.6 7.7  ALBUMIN  --  4.3 4.1   Recent Labs    12/29/17 1318  LIPASE 42   No results for input(s): AMMONIA in the last 8760 hours. CBC: Recent Labs    06/03/17 09/22/17 1526 12/29/17 1318  WBC 7.0 6.3 7.7  NEUTROABS  --  4.1 5.3  HGB 14.1 13.9 14.2  HCT 43 41.6 44.3  MCV  --  88.0 91.0  PLT 250 246.0 280   Lipid Panel: Recent Labs    06/03/17 07/30/17 11/25/17 1020  CHOL 169 168 267*  HDL 66 65 65  LDLCALC 87 84 179*  TRIG 82 97 107  CHOLHDL  --   --  4.1   TSH: Recent Labs    09/22/17 1526  TSH 2.02  A1C: No results found for: HGBA1C   Assessment/Plan 1. Dementia without behavioral disturbance, unspecified dementia type (HCC) Ongoing, slow decline. Continues supportive care.   2. Essential hypertension Stable, follow current plan of treatment  3. Osteoporosis, unspecified osteoporosis type, unspecified pathological fracture presence Ongoing, discussed recent dexa scan. To continue cal and vit d with weight bearing activity, daughter does not wish for pt to be on medication at this time.   4. Gastroesophageal reflux disease without esophagitis improved. Pt takes Protonix 40 mg daily. Denies any abdominal discomfort, heartburn, reflux, nausea or vomiting. Can decrease protonix to 20 mg daily and monitor. Continue diet modifications.   5. CKD (chronic kidney disease) stage 3, GFR 30-59 ml/min (HCC) Decrease GRF and creatinine level. Following up with  BMP.  Encourage proper hydration and to avoid NSAIDS (Aleve, Advil, Motrin, Ibuprofen)  - BASIC METABOLIC PANEL WITH GFR  6. Seasonal allergies -worse with seasons.  Encouraged zyrtec or claritin 10 mg daily  - albuterol (PROVENTIL HFA;VENTOLIN HFA) 108 (90 Base) MCG/ACT inhaler; Inhale 1-2 puffs into the lungs every 6 (six) hours as needed for wheezing or shortness of breath.  Dispense: 1 Inhaler; Refill: 2  7. Hyperlipidemia  Continue with diet modifications, does not request medication.   8. nocturnal hypoxia  Continue nighttime oxygen.    Next appt: 6 months, sooner if needed  Bettyjean Stefanski K. Biagio Borg  I personally was present during the history, physical exam and medical decision-making activities of this service and have verified that the service and findings are accurately documented in the student's note   [facility] [phone]

## 2018-03-31 ENCOUNTER — Telehealth: Payer: Self-pay

## 2018-03-31 LAB — BASIC METABOLIC PANEL WITH GFR
BUN/Creatinine Ratio: 16 (calc) (ref 6–22)
BUN: 15 mg/dL (ref 7–25)
CO2: 31 mmol/L (ref 20–32)
CREATININE: 0.96 mg/dL — AB (ref 0.60–0.88)
Calcium: 9.3 mg/dL (ref 8.6–10.4)
Chloride: 104 mmol/L (ref 98–110)
GFR, Est African American: 65 mL/min/{1.73_m2} (ref >=60)
GFR, Est Non African American: 56 mL/min/{1.73_m2} — ABNORMAL LOW (ref >=60)
Glucose, Bld: 101 mg/dL (ref 65–139)
Potassium: 3.3 mmol/L — ABNORMAL LOW (ref 3.5–5.3)
Sodium: 142 mmol/L (ref 135–146)

## 2018-03-31 NOTE — Telephone Encounter (Addendum)
Call placed to [facility] and [facility] to inquire about patient immunizations.Shelia with [facility] stated they only offer influenza and the patient did not have one done this flu season.   I called patient daughter [name] and left voicemail for her to return call as requested by Abbey Chatters. To inform her that we were not able to get immunization records and confirm there was no other facility the patient may had gone to because the patient is due for pneumonia vaccine

## 2018-04-15 ENCOUNTER — Ambulatory Visit: Payer: Medicare HMO | Admitting: Family Medicine

## 2018-05-08 NOTE — Progress Notes (Signed)
Tawana ScaleZach  D.O. [facility] [address] [place], [place] [address] Phone: [phone] Subjective:   I Ronelle NighKana Thompson am serving as a Neurosurgeonscribe for Dr. Antoine PrimasZachary .    CC: Knee pain follow-up  BJY:[id number]:Subjective   01/19/2018 Bilateral injections given today.  Discussed icing regimen and home exercise.  Discussed which activities to do which wants to avoid.  Patient is to increase activity slowly over the course of next several days.  Patient could be getting viscosupplementation again in 3 months.  Patient will follow-up at that time.  Patient wants to avoid any surgical intervention.  05/09/2018 Chelsea LuisSonja Maddox is a 83 y.o. female coming in with complaint of bilateral knee pain follow-up.  Seen 3 months ago and responded fairly well to injections.  Patient states the knees bother her bilaterally daily. Would like monovisc instead of steroid. Wants to know lab results as well.  Known arthritic changes.  Patient also has some baseline dementia.  Patient is accompanied with daughter who is the historian.  We did do viscosupplementation October and seemed to be more beneficial than anything else recently.      Past Medical History:  Diagnosis Date  . Allergy   . Arthritis   . Chicken pox   . COPD (chronic obstructive pulmonary disease) (HCC)   . Dementia (HCC)   . Dementia without behavioral disturbance (HCC)    unspecified type  . DJD (degenerative joint disease)   . Generalized headaches   . Hyperlipidemia   . Hypertension   . Hypertensive retinopathy of both eyes   . Kidney disease   . Low back pain   . Macular degeneration   . Major depressive disorder   . Neuropathy   . Nuclear sclerotic cataract of both eyes   . Osteoarthritis   . Peripheral neuropathy   . Retinal edema   . Thyroid disease   . UTI (urinary tract infection)   . Vertigo   . Vitamin D deficiency    Past Surgical History:  Procedure Laterality Date  . ABDOMINAL HYSTERECTOMY    . COLONOSCOPY  2012   . SACRAL NERVE STIMULATOR PLACEMENT  2018  . TRIGGER FINGER RELEASE     Social History   Socioeconomic History  . Marital status: Married    Spouse name: Not on file  . Number of children: 3  . Years of education: Not on file  . Highest education level: Not on file  Occupational History  . Not on file  Social Needs  . Financial resource strain: Not on file  . Food insecurity:    Worry: Not on file    Inability: Not on file  . Transportation needs:    Medical: Not on file    Non-medical: Not on file  Tobacco Use  . Smoking status: Never Smoker  . Smokeless tobacco: Never Used  Substance and Sexual Activity  . Alcohol use: Never    Frequency: Never  . Drug use: Never  . Sexual activity: Not Currently  Lifestyle  . Physical activity:    Days per week: Not on file    Minutes per session: Not on file  . Stress: Not on file  Relationships  . Social connections:    Talks on phone: Not on file    Gets together: Not on file    Attends religious service: Not on file    Active member of club or organization: Not on file    Attends meetings of clubs or organizations: Not on  file    Relationship status: Not on file  Other Topics Concern  . Not on file  Social History Narrative   Social History      Diet?       Do you drink/eat things with caffeine? yes      Marital status?        married                            What year were you married? 1957      Do you live in a house, apartment, assisted living, condo, trailer, etc.? house      Is it one or more stories? 1      How many persons live in your home? 4      Do you have any pets in your home? (please list) 3 dogs, 1 cat      Highest level of education completed? Some college      Current or past profession: homemaker      Do you exercise?       no                               Type & how often?      Advanced Directives      Do you have a living will? yes      Do you have a DNR form?                                   If not, do you want to discuss one? no      Do you have signed POA/HPOA for forms? yes      Functional Status      Do you have difficulty bathing or dressing yourself? yes      Do you have difficulty preparing food or eating?  yes      Do you have difficulty managing your medications? yes      Do you have difficulty managing your finances? yes      Do you have difficulty affording your medications? no   No Known Allergies Family History  Problem Relation Age of Onset  . Arthritis Mother   . Diabetes Mother   . Hearing loss Mother   . Hyperlipidemia Mother   . Hypertension Mother   . Aneurysm Mother   . Arthritis Father   . COPD Father   . Hearing loss Father   . Heart disease Father   . Hyperlipidemia Father   . Hypertension Father   . Kidney disease Father   . Anuerysm Father        aortic arch  . Dementia Father   . Arthritis Sister 31  . Hypertension Sister   . Macular degeneration Sister   . Aneurysm Sister        aortic  . Arthritis Sister   . Hearing loss Sister   . Hypertension Sister   . Aneurysm Sister        brain  . Early death Brother 58  . Hyperlipidemia Brother   . Hypertension Brother   . Aneurysm Brother        aortic  . Arthritis Brother   . Hypertension Brother   . Gout Brother   . Macular degeneration Brother   . Aneurysm Brother  aortic  . Other Brother 71       [place]  . Hyperlipidemia Daughter 21  . Hypertension Daughter   . Depression Daughter   . Arthritis Daughter   . Arthritis Daughter   . Hypertension Son      Current Outpatient Medications (Cardiovascular):  .  losartan-hydrochlorothiazide (HYZAAR) 100-12.5 MG tablet, Take 1 tablet by mouth daily.  Current Outpatient Medications (Respiratory):  .  albuterol (PROVENTIL HFA;VENTOLIN HFA) 108 (90 Base) MCG/ACT inhaler, Inhale 1-2 puffs into the lungs every 6 (six) hours as needed for wheezing or shortness of breath.    Current Outpatient  Medications (Other):  .  cholecalciferol (VITAMIN D) 1000 units tablet, Take 5,000 Units by mouth daily. Marland Kitchen  escitalopram (LEXAPRO) 10 MG tablet, Take 1 tablet (10 mg total) by mouth daily. .  Melatonin 5 MG CAPS, Take 1 capsule by mouth at bedtime. .  Misc Natural Products (TART CHERRY ADVANCED PO), Take 1 capsule by mouth daily. .  Multiple Vitamins-Minerals (PRESERVISION AREDS PO), Take 1 capsule by mouth 2 (two) times daily. .  pantoprazole (PROTONIX) 40 MG tablet, Take 1 tablet (40 mg total) by mouth daily. Marland Kitchen  Resveratrol 100 MG CAPS, Take 100 mg by mouth.     Past medical history, social, surgical and family history all reviewed in electronic medical record.  No pertanent information unless stated regarding to the chief complaint.   Review of Systems:  No headache, visual changes, nausea, vomiting, diarrhea, constipation, dizziness, abdominal pain, skin rash, fevers, chills, night sweats, weight loss, swollen lymph nodes, body aches, j chest pain, shortness of breath, mood changes.  Positive muscle aches and joint swelling Objective  Blood pressure 122/70, pulse 64, height  (1.626 m), weight 147 lb (66.7 kg), SpO2 97 %.    General: No apparent distress alert she knows not oriented. HEENT: Pupils equal, extraocular movements intact  Respiratory: Patient's speak in full sentences and does not appear short of breath  Cardiovascular: Trace lower extremity edema, non tender, no erythema  Skin: Warm dry intact with no signs of infection or rash on extremities or on axial skeleton.  Abdomen: Soft nontender  Neuro: Cranial nerves II through XII are intact, neurovascularly intact in all extremities with 2+ DTRs and 2+ pulses.  Lymph: No lymphadenopathy of posterior or anterior cervical chain or axillae bilaterally.  Gait antalgic MSK:  tender with full range of motion and good stability and symmetric strength and tone of shoulders, elbows, wrist, hip, and ankles bilaterally.  Knee:  Bilateral valgus deformity noted.  Abnormal thigh to calf ratio.  Tender to palpation over medial and PF joint line.  ROM full in flexion and extension and lower leg rotation. instability with valgus force.  painful patellar compression. Patellar glide with moderate crepitus. Patellar and quadriceps tendons unremarkable. Hamstring and quadriceps strength is normal.  After informed written and verbal consent, patient was seated on exam table. Right knee was prepped with alcohol swab and utilizing anterolateral approach, patient's right knee space was injected with22 mg/1  mL of Monovisc (sodium hyaluronate) in a prefilled syringe was injected easily into the knee through a 22-gauge needle..Patient tolerated the procedure well without immediate complications.  After informed written and verbal consent, patient was seated on exam table. Left knee was prepped with alcohol swab and utilizing anterolateral approach, patient's left knee space was injected with22 mg/1 mL of Monovisc (sodium hyaluronate) in a prefilled syringe was injected easily into the knee through a 22-gauge needle..Patient tolerated the procedure  well without immediate complications.  Patient provided medication   Impression and Recommendations:     This case required medical decision making of moderate complexity. The above documentation has been reviewed and is accurate and complete Judi Saa, DO       Note: This dictation was prepared with Dragon dictation along with smaller phrase technology. Any transcriptional errors that result from this process are unintentional.

## 2018-05-09 ENCOUNTER — Ambulatory Visit: Payer: Medicare HMO | Admitting: Family Medicine

## 2018-05-09 ENCOUNTER — Encounter: Payer: Self-pay | Admitting: Family Medicine

## 2018-05-09 ENCOUNTER — Other Ambulatory Visit: Payer: Self-pay

## 2018-05-09 ENCOUNTER — Other Ambulatory Visit (INDEPENDENT_AMBULATORY_CARE_PROVIDER_SITE_OTHER): Payer: Medicare HMO

## 2018-05-09 VITALS — BP 122/70 | HR 64 | Ht 64.0 in | Wt 147.0 lb

## 2018-05-09 DIAGNOSIS — M2550 Pain in unspecified joint: Secondary | ICD-10-CM

## 2018-05-09 DIAGNOSIS — M170 Bilateral primary osteoarthritis of knee: Secondary | ICD-10-CM | POA: Diagnosis not present

## 2018-05-09 LAB — VITAMIN D 25 HYDROXY (VIT D DEFICIENCY, FRACTURES): VITD: 50.7 ng/mL (ref 30.00–100.00)

## 2018-05-09 NOTE — Assessment & Plan Note (Signed)
Bilateral knee injections given.  Tolerated the procedure well.  Discussed icing regimen and home exercise.  Discussed which activities to do which wants to avoid.  Patient is to increase activity when possible.  Follow-up again 4 to 8 weeks

## 2018-05-10 LAB — PTH, INTACT AND CALCIUM
Calcium: 9.6 mg/dL (ref 8.6–10.4)
PTH: 29 pg/mL (ref 14–64)

## 2018-05-10 LAB — CALCIUM, IONIZED: Calcium, Ion: 5.13 mg/dL (ref 4.8–5.6)

## 2018-05-13 ENCOUNTER — Other Ambulatory Visit: Payer: Self-pay | Admitting: Nurse Practitioner

## 2018-05-24 DIAGNOSIS — Z961 Presence of intraocular lens: Secondary | ICD-10-CM | POA: Diagnosis not present

## 2018-05-24 DIAGNOSIS — H35033 Hypertensive retinopathy, bilateral: Secondary | ICD-10-CM | POA: Diagnosis not present

## 2018-05-24 DIAGNOSIS — H353131 Nonexudative age-related macular degeneration, bilateral, early dry stage: Secondary | ICD-10-CM | POA: Diagnosis not present

## 2018-06-24 DIAGNOSIS — R69 Illness, unspecified: Secondary | ICD-10-CM | POA: Diagnosis not present

## 2018-06-25 ENCOUNTER — Other Ambulatory Visit: Payer: Self-pay | Admitting: Nurse Practitioner

## 2018-09-22 DIAGNOSIS — I10 Essential (primary) hypertension: Secondary | ICD-10-CM | POA: Diagnosis not present

## 2018-09-22 DIAGNOSIS — G8929 Other chronic pain: Secondary | ICD-10-CM | POA: Diagnosis not present

## 2018-09-22 DIAGNOSIS — G301 Alzheimer's disease with late onset: Secondary | ICD-10-CM | POA: Diagnosis not present

## 2018-09-22 DIAGNOSIS — R69 Illness, unspecified: Secondary | ICD-10-CM | POA: Diagnosis not present

## 2018-09-22 DIAGNOSIS — E785 Hyperlipidemia, unspecified: Secondary | ICD-10-CM | POA: Diagnosis not present

## 2018-09-29 DIAGNOSIS — R69 Illness, unspecified: Secondary | ICD-10-CM | POA: Diagnosis not present

## 2018-09-30 ENCOUNTER — Ambulatory Visit (INDEPENDENT_AMBULATORY_CARE_PROVIDER_SITE_OTHER): Payer: Medicare HMO | Admitting: Nurse Practitioner

## 2018-09-30 ENCOUNTER — Encounter: Payer: Self-pay | Admitting: Nurse Practitioner

## 2018-09-30 ENCOUNTER — Telehealth: Payer: Self-pay

## 2018-09-30 ENCOUNTER — Other Ambulatory Visit: Payer: Self-pay

## 2018-09-30 VITALS — BP 140/72 | HR 60 | Temp 97.5°F | Ht 64.0 in | Wt 152.0 lb

## 2018-09-30 DIAGNOSIS — N183 Chronic kidney disease, stage 3 unspecified: Secondary | ICD-10-CM

## 2018-09-30 DIAGNOSIS — R69 Illness, unspecified: Secondary | ICD-10-CM | POA: Diagnosis not present

## 2018-09-30 DIAGNOSIS — F0391 Unspecified dementia with behavioral disturbance: Secondary | ICD-10-CM | POA: Diagnosis not present

## 2018-09-30 DIAGNOSIS — G4734 Idiopathic sleep related nonobstructive alveolar hypoventilation: Secondary | ICD-10-CM

## 2018-09-30 DIAGNOSIS — M810 Age-related osteoporosis without current pathological fracture: Secondary | ICD-10-CM | POA: Diagnosis not present

## 2018-09-30 DIAGNOSIS — J302 Other seasonal allergic rhinitis: Secondary | ICD-10-CM | POA: Diagnosis not present

## 2018-09-30 DIAGNOSIS — Z23 Encounter for immunization: Secondary | ICD-10-CM | POA: Diagnosis not present

## 2018-09-30 DIAGNOSIS — I10 Essential (primary) hypertension: Secondary | ICD-10-CM

## 2018-09-30 DIAGNOSIS — F419 Anxiety disorder, unspecified: Secondary | ICD-10-CM | POA: Diagnosis not present

## 2018-09-30 DIAGNOSIS — M170 Bilateral primary osteoarthritis of knee: Secondary | ICD-10-CM

## 2018-09-30 DIAGNOSIS — K219 Gastro-esophageal reflux disease without esophagitis: Secondary | ICD-10-CM | POA: Diagnosis not present

## 2018-09-30 DIAGNOSIS — E785 Hyperlipidemia, unspecified: Secondary | ICD-10-CM

## 2018-09-30 NOTE — Telephone Encounter (Signed)
I called [employer] at [phone], after a long hold I was told I need to call [phone].  I called above number, spoke with Melissa and was told there was a lot of communication and follow-up to be done by the daughter prior to them providing oxygen. The last communication was that the daughter was to contact the pharmacy in [place] and have them forward records indicating patient was on an inhaler or nebulizer, since our note did not indicate that she was on nebulizer or inhaler. Orders and testing is good for 30 days only.    Patient will now need: New order, new testing and new office note (documenting walk test and currently on albuterol or nebulizer). Testing done in January will have to be repeated

## 2018-09-30 NOTE — Patient Instructions (Signed)
For follow up questions to  call [phone].  Chelsea Maddox and was told there was a lot of communication and follow-up to be done by the daughter prior to them providing oxygen. requested the daughter was to contact the pharmacy in [place] and have them forward records indicating patient was on an inhaler or nebulizer orders and testing is good for 30 days only and will need to be repeated  Patient will now need: New order (done), new testing (needs to be completed) and new office note (documenting walk test and currently on albuterol or nebulizer- (done)).  Testing done in January will have to be repeated   Please let us know if there is anything else we need to do

## 2018-09-30 NOTE — Progress Notes (Signed)
Careteam: Patient Care Team: Sharon SellerEubanks, Zain Lankford K, NP as PCP - General (Geriatric Medicine) Judi SaaSmith, Zachary M, DO as Consulting Physician (Family Medicine) Eber JonesShah, Rajiv E, MD as Referring Physician (Ophthalmology)  Advanced Directive information Does Patient Have a Medical Advance Directive?: Yes, Type of Advance Directive: Healthcare Power of Attorney, Does patient want to make changes to medical advance directive?: No - Patient declined  No Known Allergies  Chief Complaint  Patient presents with  . Medical Management of Chronic Issues    6 month follow-up, Discuss oxygen. Here with daughter  . Immunizations    Discuss need for TD, PNA. Flu vaccine today      HPI: Patient is a 83 y.o. female seen in the office today for routine follow up.  Seasonal allergies/cough - Pt uses albuterol inhaler during her allergic flare ups.    nocturnal hypoxia- sleep study reveal o2 at 77 during sleep.  Pt uses oxygen during night. She is on  2L O2. Oxygen was prescribed based on sleep study results. She has tried albuterol in the past without improvement in O2 sats. Uses albuterol as needed at this time. Uses albuterol occasional during allergy season. Did the follow up studies done here but could not get oxygen approved. Still using old oxygen prior to moving. Daughter states she has not touched base with them since March of this year or earlier   GERD- has been weaned off protonix and doing well without it. Eating more. No nausea, vomiting. Appetite has improved.   Weight lost- Pt's daughter tries to provide healthy meals for the patient. Pt is encouraged by daughter to limits sodas, less fast food and eating out, increased vegetable intake, and low sodium intake. Weight gain noted since last visit.   Hypertension- stable blood pressure , Pt takes Hyzaar 100-12.5mg  tab. Blood pressure at goal home, generally in the 130s/60-70.  Neuropathy- decreased sensitivity in both feet. Pt has a nerve  stimulator in the back, that helped with pain during the night. Pt is able to sleep much better now.   Osteoarthritis - pain in both knees, and back. Pt sees Dr. Katrinka BlazingSmith.  And had steroid injection in April. Pt uses OTC cream or Tylenol for pain at home. Insurance did not approve injection.   Dementia- followed with geriatric specialist. Did not change anything but to keep her active. "lost a little ground" Pt's memory got slightly worse. Had a hard time getting her out of the tub.  Pt is living with son. Plan is to move her into a home in the daughters backyard, will be completed in the next 2-3 months. Occasional behaviors, likes music. Continues on supplement   Hyperlipidemia - continues to wish to hold off on medication. In the past has caused joint pain.   Vitamin D defficiency- pt continues to take Cholecalciferol 5000 units tablet. vit d level was stable in April.   Osteoporosis- daughter decline medication, continues with cal and vit d with weight bearing activity  Unsure if they got the vaccines- daughter did not call insurance to verify pneumonia vaccine.    Insomnia- sometimes she sleeps great others she gets up in the middle of the night. continues on melatonin   Anxiety- controlled on lexapro  Review of Systems:  Review of Systems  Unable to perform ROS: Dementia    Past Medical History:  Diagnosis Date  . Allergy   . Arthritis   . Chicken pox   . COPD (chronic obstructive pulmonary disease) (HCC)   .  Dementia (HCC)   . Dementia without behavioral disturbance (HCC)    unspecified type  . DJD (degenerative joint disease)   . Generalized headaches   . Hyperlipidemia   . Hypertension   . Hypertensive retinopathy of both eyes   . Kidney disease   . Low back pain   . Macular degeneration   . Major depressive disorder   . Neuropathy   . Nuclear sclerotic cataract of both eyes   . Osteoarthritis   . Peripheral neuropathy   . Retinal edema   . Thyroid disease   .  UTI (urinary tract infection)   . Vertigo   . Vitamin D deficiency    Past Surgical History:  Procedure Laterality Date  . ABDOMINAL HYSTERECTOMY    . COLONOSCOPY  2012  . SACRAL NERVE STIMULATOR PLACEMENT  2018  . TRIGGER FINGER RELEASE     Social History:   reports that she has never smoked. She has never used smokeless tobacco. She reports that she does not drink alcohol or use drugs.  Family History  Problem Relation Age of Onset  . Arthritis Mother   . Diabetes Mother   . Hearing loss Mother   . Hyperlipidemia Mother   . Hypertension Mother   . Aneurysm Mother   . Arthritis Father   . COPD Father   . Hearing loss Father   . Heart disease Father   . Hyperlipidemia Father   . Hypertension Father   . Kidney disease Father   . Anuerysm Father        aortic arch  . Dementia Father   . Arthritis Sister 56  . Hypertension Sister   . Macular degeneration Sister   . Aneurysm Sister        aortic  . Arthritis Sister   . Hearing loss Sister   . Hypertension Sister   . Aneurysm Sister        brain  . Early death Brother 64  . Hyperlipidemia Brother   . Hypertension Brother   . Aneurysm Brother        aortic  . Arthritis Brother   . Hypertension Brother   . Gout Brother   . Macular degeneration Brother   . Aneurysm Brother        aortic  . Other Brother 48       [place]  . Hyperlipidemia Daughter 51  . Hypertension Daughter   . Depression Daughter   . Arthritis Daughter   . Arthritis Daughter   . Hypertension Son     Medications: Patient's Medications  New Prescriptions   No medications on file  Previous Medications   ALBUTEROL (PROVENTIL HFA;VENTOLIN HFA) 108 (90 BASE) MCG/ACT INHALER    Inhale 1-2 puffs into the lungs every 6 (six) hours as needed for wheezing or shortness of breath.   CHERRY PO    Take by mouth. Tart Cherry 1 by mouth daily   CHOLECALCIFEROL (VITAMIN D3) 50 MCG (2000 UT) TABS    Take 1 tablet by mouth daily.   ESCITALOPRAM  (LEXAPRO) 10 MG TABLET    TAKE 1 TABLET BY MOUTH EVERY DAY   LOSARTAN-HYDROCHLOROTHIAZIDE (HYZAAR) 100-12.5 MG TABLET    TAKE 1 TABLET BY MOUTH EVERY DAY   MELATONIN 5 MG CAPS    Take 1 capsule by mouth at bedtime.   MULTIPLE VITAMINS-MINERALS (PRESERVISION AREDS PO)    Take 1 capsule by mouth 2 (two) times daily.   OXYGEN    Inhale 2 L into  the lungs at bedtime.   PROBIOTIC PRODUCT (PROBIOTIC DAILY PO)    Take 1 tablet by mouth daily.   RESVERATROL 100 MG CAPS    Take 100 mg by mouth.    UNABLE TO FIND    Med Name: Mushroom drop daily for memory   UNABLE TO FIND    Med Name: CBD Oil, 1/2 drop twice daily  Modified Medications   No medications on file  Discontinued Medications   CHOLECALCIFEROL (VITAMIN D) 1000 UNITS TABLET    Take 5,000 Units by mouth daily.   MISC NATURAL PRODUCTS (TART CHERRY ADVANCED PO)    Take 1 capsule by mouth daily.   PANTOPRAZOLE (PROTONIX) 40 MG TABLET    TAKE 1 TABLET BY MOUTH EVERY DAY    Physical Exam:  Vitals:   09/30/18 1423  BP: 140/72  Pulse: 60  Temp: (!) 97.5 F (36.4 C)  TempSrc: Temporal  SpO2: 97%  Weight: 152 lb (68.9 kg)  Height: 5\' 4"  (1.626 m)   Body mass index is 26.09 kg/m. Wt Readings from Last 3 Encounters:  09/30/18 152 lb (68.9 kg)  05/09/18 147 lb (66.7 kg)  03/30/18 148 lb 3.2 oz (67.2 kg)    Physical Exam Vitals signs reviewed.  Constitutional:      Appearance: Normal appearance.  HENT:     Head: Normocephalic and atraumatic.     Right Ear: Tympanic membrane, ear canal and external ear normal.     Left Ear: Tympanic membrane, ear canal and external ear normal.     Nose: No congestion or rhinorrhea.     Mouth/Throat:     Mouth: Mucous membranes are moist.     Pharynx: Oropharynx is clear.  Eyes:     General:        Right eye: No discharge.        Left eye: No discharge.     Extraocular Movements: Extraocular movements intact.     Pupils: Pupils are equal, round, and reactive to light.  Cardiovascular:      Rate and Rhythm: Normal rate and regular rhythm.     Pulses: Normal pulses.     Heart sounds: Normal heart sounds.  Pulmonary:     Effort: Pulmonary effort is normal. No respiratory distress.     Breath sounds: Normal breath sounds. No wheezing or rales.  Abdominal:     General: Bowel sounds are normal.     Palpations: Abdomen is soft.     Tenderness: There is no abdominal tenderness.  Musculoskeletal:     Right lower leg: No edema.     Left lower leg: No edema.  Skin:    General: Skin is warm and dry.  Neurological:     Mental Status: She is alert. Mental status is at baseline.  Psychiatric:        Mood and Affect: Mood normal.    Labs reviewed: Basic Metabolic Panel: Recent Labs    12/29/17 1318 03/30/18 1255 05/09/18 1206  NA 139 142  --   K 3.7 3.3*  --   CL 106 104  --   CO2 23 31  --   GLUCOSE 115* 101  --   BUN 17 15  --   CREATININE 1.13* 0.96*  --   CALCIUM 9.6 9.3 9.6   Liver Function Tests: Recent Labs    12/29/17 1318  AST 40  ALT 31  ALKPHOS 82  BILITOT 0.8  PROT 7.7  ALBUMIN 4.1   Recent Labs  12/29/17 1318  LIPASE 42   No results for input(s): AMMONIA in the last 8760 hours. CBC: Recent Labs    12/29/17 1318  WBC 7.7  NEUTROABS 5.3  HGB 14.2  HCT 44.3  MCV 91.0  PLT 280   Lipid Panel: Recent Labs    11/25/17 1020  CHOL 267*  HDL 65  LDLCALC 179*  TRIG 107  CHOLHDL 4.1   TSH: No results for input(s): TSH in the last 8760 hours. A1C: No results found for: HGBA1C   Assessment/Plan 1. Dementia with behavioral disturbance, unspecified dementia type ([place]) Progressive decline, no longer on dementia related medications.  Continues to take supplements and support provided by family. Trying to keep her active.   2. Anxiety Stable on lexapro.  3. CKD (chronic kidney disease) stage 3, GFR 30-59 ml/min (HCC) -Encourage proper hydration and to avoid NSAIDS (Aleve, Advil, Motrin, Ibuprofen)  - COMPLETE METABOLIC PANEL WITH  GFR - CBC with Differential/Platelet  4. Essential hypertension -stable on current regimen with dietary modifications. - COMPLETE METABOLIC PANEL WITH GFR - CBC with Differential/Platelet  5. Gastroesophageal reflux disease without esophagitis -off protonix and no longer having symptoms  6. Osteoporosis, unspecified osteoporosis type, unspecified pathological fracture presence -continues on cal and vit d, encouraged weight bearing activity  7. Nocturnal hypoxia -requiring O2 at bedtime but has not gotten approve through [employer]. See phone note today.  Walking O2 today- not currently on oxygen and she started out walking at 99% and dropped to 93 with ambulation. Walked back and forth in office for 6 mins. O2 went back up to 97% on RA Will again request for evaluation and retesting as she qualified in the past but it has been longer than 30 day.   8. Hyperlipidemia, unspecified hyperlipidemia type -daughter desires her lipids to be on the higher side and does not wish to be on medications.   9. Seasonal allergies -controlled at this time.   10. Primary osteoarthritis of both knees -had injection by Dr Tamala Julian but not very effective. Continues on tylenol with muscle rub.   Next appt: 6 months Marlo Goodrich K. [place], [facility] [phone]

## 2018-10-01 LAB — COMPLETE METABOLIC PANEL WITH GFR
AG Ratio: 1.6 (calc) (ref 1.0–2.5)
ALT: 11 U/L (ref 6–29)
AST: 16 U/L (ref 10–35)
Albumin: 4.3 g/dL (ref 3.6–5.1)
Alkaline phosphatase (APISO): 76 U/L (ref 37–153)
BUN/Creatinine Ratio: 17 (calc) (ref 6–22)
BUN: 17 mg/dL (ref 7–25)
CO2: 29 mmol/L (ref 20–32)
Calcium: 9.6 mg/dL (ref 8.6–10.4)
Chloride: 104 mmol/L (ref 98–110)
Creat: 1.02 mg/dL — ABNORMAL HIGH (ref 0.60–0.88)
GFR, Est African American: 60 mL/min/{1.73_m2} (ref >=60)
GFR, Est Non African American: 52 mL/min/{1.73_m2} — ABNORMAL LOW (ref >=60)
Globulin: 2.7 g/dL (calc) (ref 1.9–3.7)
Glucose, Bld: 102 mg/dL (ref 65–139)
Potassium: 3.9 mmol/L (ref 3.5–5.3)
Sodium: 140 mmol/L (ref 135–146)
Total Bilirubin: 0.5 mg/dL (ref 0.2–1.2)
Total Protein: 7 g/dL (ref 6.1–8.1)

## 2018-10-01 LAB — CBC WITH DIFFERENTIAL/PLATELET
Absolute Monocytes: 556 cells/uL (ref 200–950)
Basophils Absolute: 27 cells/uL (ref 0–200)
Basophils Relative: 0.4 %
Eosinophils Absolute: 181 cells/uL (ref 15–500)
Eosinophils Relative: 2.7 %
HCT: 37 % (ref 35.0–45.0)
Hemoglobin: 12 g/dL (ref 11.7–15.5)
Lymphs Abs: 1487 cells/uL (ref 850–3900)
MCH: 29 pg (ref 27.0–33.0)
MCHC: 32.4 g/dL (ref 32.0–36.0)
MCV: 89.4 fL (ref 80.0–100.0)
MPV: 10.2 fL (ref 7.5–12.5)
Monocytes Relative: 8.3 %
Neutro Abs: 4449 cells/uL (ref 1500–7800)
Neutrophils Relative %: 66.4 %
Platelets: 248 10*3/uL (ref 140–400)
RBC: 4.14 10*6/uL (ref 3.80–5.10)
RDW: 12.1 % (ref 11.0–15.0)
Total Lymphocyte: 22.2 %
WBC: 6.7 10*3/uL (ref 3.8–10.8)

## 2018-10-03 NOTE — Telephone Encounter (Signed)
Information given to daughter and completed the walking and need portion in office

## 2018-10-03 NOTE — Telephone Encounter (Signed)
Demographic information filled out on [employer] form and placed in providers review and sign folder with last OV note. Once for is completed it will be faxed to [phone]

## 2018-10-04 NOTE — Telephone Encounter (Signed)
Chelsea Maddox completed form yesterday and had medical records fax

## 2018-10-12 ENCOUNTER — Other Ambulatory Visit: Payer: Self-pay | Admitting: Nurse Practitioner

## 2018-11-18 ENCOUNTER — Telehealth: Payer: Self-pay | Admitting: *Deleted

## 2018-11-18 NOTE — Telephone Encounter (Signed)
Received fax from [employer] [phone] Sleep Study Candidate Order Form.  Placed form in [place] folder to review and sign. To be faxed back to Fax: [phone] once completed.

## 2018-12-29 DIAGNOSIS — R69 Illness, unspecified: Secondary | ICD-10-CM | POA: Diagnosis not present

## 2019-01-02 DIAGNOSIS — R69 Illness, unspecified: Secondary | ICD-10-CM | POA: Diagnosis not present

## 2019-02-01 ENCOUNTER — Ambulatory Visit (INDEPENDENT_AMBULATORY_CARE_PROVIDER_SITE_OTHER): Payer: Medicare HMO | Admitting: Nurse Practitioner

## 2019-02-01 ENCOUNTER — Encounter: Payer: Self-pay | Admitting: Nurse Practitioner

## 2019-02-01 ENCOUNTER — Other Ambulatory Visit: Payer: Self-pay

## 2019-02-01 DIAGNOSIS — Z0000 Encounter for general adult medical examination without abnormal findings: Secondary | ICD-10-CM | POA: Diagnosis not present

## 2019-02-01 NOTE — Progress Notes (Signed)
Subjective:   Chelsea Maddox is a 84 y.o. female who presents for Medicare Annual (Subsequent) preventive examination.  Review of Systems:   Cardiac Risk Factors include: hypertension;dyslipidemia;sedentary lifestyle;advanced age (>52men, >36 women)     Objective:     Vitals: There were no vitals taken for this visit.  There is no height or weight on file to calculate BMI.  Advanced Directives 02/01/2019 02/01/2019 09/30/2018 01/10/2018 12/29/2017 12/20/2017 11/25/2017  Does Patient Have a Medical Advance Directive? Yes Yes Yes No Yes Yes Yes  Type of Estate agent of [place];Living will Healthcare Power of [place];Living will Healthcare Power of Attorney - - Healthcare Power of [employer] Power of Attorney  Does patient want to make changes to medical advance directive? No - Patient declined No - Patient declined No - Patient declined - - - -  Copy of Healthcare Power of Attorney in Chart? Yes - validated most recent copy scanned in chart (See row information) Yes - validated most recent copy scanned in chart (See row information) Yes - validated most recent copy scanned in chart (See row information) No - copy requested - Yes - validated most recent copy scanned in chart (See row information) No - copy requested    Tobacco Social History   Tobacco Use  Smoking Status Never Smoker  Smokeless Tobacco Never Used     Counseling given: Not Answered   Clinical Intake:  Pre-visit preparation completed: Yes  Pain : No/denies pain     BMI - recorded: 26.09 Nutritional Status: BMI 25 -29 Overweight Diabetes: No  How often do you need to have someone help you when you read instructions, pamphlets, or other written materials from your doctor or pharmacy?: 5 - Always        Past Medical History:  Diagnosis Date  . Allergy   . Arthritis   . Chicken pox   . COPD (chronic obstructive pulmonary disease) (HCC)   . Dementia (HCC)   . Dementia  without behavioral disturbance (HCC)    unspecified type  . DJD (degenerative joint disease)   . Generalized headaches   . Hyperlipidemia   . Hypertension   . Hypertensive retinopathy of both eyes   . Kidney disease   . Low back pain   . Macular degeneration   . Major depressive disorder   . Neuropathy   . Nuclear sclerotic cataract of both eyes   . Osteoarthritis   . Peripheral neuropathy   . Retinal edema   . Thyroid disease   . UTI (urinary tract infection)   . Vertigo   . Vitamin D deficiency    Past Surgical History:  Procedure Laterality Date  . ABDOMINAL HYSTERECTOMY    . COLONOSCOPY  2012  . SACRAL NERVE STIMULATOR PLACEMENT  2018  . TRIGGER FINGER RELEASE     Family History  Problem Relation Age of Onset  . Arthritis Mother   . Diabetes Mother   . Hearing loss Mother   . Hyperlipidemia Mother   . Hypertension Mother   . Aneurysm Mother   . Arthritis Father   . COPD Father   . Hearing loss Father   . Heart disease Father   . Hyperlipidemia Father   . Hypertension Father   . Kidney disease Father   . Anuerysm Father        aortic arch  . Dementia Father   . Arthritis Sister 44  . Hypertension Sister   . Macular degeneration Sister   .  Aneurysm Sister        aortic  . Arthritis Sister   . Hearing loss Sister   . Hypertension Sister   . Aneurysm Sister        brain  . Early death Brother 1  . Hyperlipidemia Brother   . Hypertension Brother   . Aneurysm Brother        aortic  . Arthritis Brother   . Hypertension Brother   . Gout Brother   . Macular degeneration Brother   . Aneurysm Brother        aortic  . Other Brother 9       [employer] [place]  . Hyperlipidemia Daughter 41  . Hypertension Daughter   . Depression Daughter   . Arthritis Daughter   . Arthritis Daughter   . Hypertension Son    Social History   Socioeconomic History  . Marital status: Married    Spouse name: Not on file  . Number of children: 3  . Years of education: Not  on file  . Highest education level: Not on file  Occupational History  . Not on file  Tobacco Use  . Smoking status: Never Smoker  . Smokeless tobacco: Never Used  Substance and Sexual Activity  . Alcohol use: Never  . Drug use: Never  . Sexual activity: Not Currently  Other Topics Concern  . Not on file  Social History Narrative   Social History      Diet?       Do you drink/eat things with caffeine? yes      Marital status?        married                            What year were you married? 1957      Do you live in a house, apartment, assisted living, condo, trailer, etc.? house      Is it one or more stories? 1      How many persons live in your home? 4      Do you have any pets in your home? (please list) 3 dogs, 1 cat      Highest level of education completed? Some college      Current or past profession: homemaker      Do you exercise?       no                               Type & how often?      Advanced Directives      Do you have a living will? yes      Do you have a DNR form?                                  If not, do you want to discuss one? no      Do you have signed POA/HPOA for forms? yes      Functional Status      Do you have difficulty bathing or dressing yourself? yes      Do you have difficulty preparing food or eating?  yes      Do you have difficulty managing your medications? yes      Do you have difficulty managing your finances? yes      Do  you have difficulty affording your medications? no   Social Determinants of Health   Financial Resource Strain:   . Difficulty of Paying Living Expenses: Not on file  Food Insecurity:   . Worried About Programme researcher, broadcasting/film/video in the Last Year: Not on file  . Ran Out of Food in the Last Year: Not on file  Transportation Needs:   . Lack of Transportation (Medical): Not on file  . Lack of Transportation (Non-Medical): Not on file  Physical Activity:   . Days of Exercise per Week: Not on file    . Minutes of Exercise per Session: Not on file  Stress:   . Feeling of Stress : Not on file  Social Connections:   . Frequency of Communication with Friends and Family: Not on file  . Frequency of Social Gatherings with Friends and Family: Not on file  . Attends Religious Services: Not on file  . Active Member of Clubs or Organizations: Not on file  . Attends Banker Meetings: Not on file  . Marital Status: Not on file    Outpatient Encounter Medications as of 02/01/2019  Medication Sig  . albuterol (PROVENTIL HFA;VENTOLIN HFA) 108 (90 Base) MCG/ACT inhaler Inhale 1-2 puffs into the lungs every 6 (six) hours as needed for wheezing or shortness of breath.  . Cholecalciferol (VITAMIN D3) 50 MCG (2000 UT) TABS Take 1 tablet by mouth daily.  Marland Kitchen escitalopram (LEXAPRO) 10 MG tablet TAKE 1 TABLET BY MOUTH EVERY DAY  . losartan-hydrochlorothiazide (HYZAAR) 100-12.5 MG tablet TAKE 1 TABLET BY MOUTH EVERY DAY  . Magnesium 400 MG CAPS Take 1 tablet by mouth daily.  . Magnesium 500 MG CAPS Take 1 capsule by mouth daily.  . Melatonin 5 MG CAPS Take 1 capsule by mouth at bedtime.  . Multiple Vitamins-Minerals (PRESERVISION AREDS PO) Take 1 capsule by mouth 2 (two) times daily.  . OXYGEN Inhale 2 L into the lungs at bedtime.  Marland Kitchen UNABLE TO FIND Med Name: Mushroom drop daily for memory  . UNABLE TO FIND Med Name: CBD Oil, 1/2 drop twice daily  . [DISCONTINUED] CHERRY PO Take by mouth. Tart Cherry 1 by mouth daily  . [DISCONTINUED] Probiotic Product (PROBIOTIC DAILY PO) Take 1 tablet by mouth daily.  . [DISCONTINUED] Resveratrol 100 MG CAPS Take 100 mg by mouth.    No facility-administered encounter medications on file as of 02/01/2019.    Activities of Daily Living In your present state of health, do you have any difficulty performing the following activities: 02/01/2019  Hearing? N  Vision? Y  Difficulty concentrating or making decisions? Y  Walking or climbing stairs? Y  Dressing or  bathing? Y  Comment family helps  Doing errands, shopping? Y  Comment family assist  Preparing Food and eating ? Y  Comment no trouble eating, family prepares food  Using the Toilet? N  In the past six months, have you accidently leaked urine? N  Do you have problems with loss of bowel control? N  Managing your Medications? Y  Managing your Finances? Y  Housekeeping or managing your Housekeeping? Y  Some recent data might be hidden    Patient Care Team: Sharon Seller, NP as PCP - General (Geriatric Medicine) Judi Saa, DO as Consulting Physician (Family Medicine) Eber Jones, MD as Referring Physician (Ophthalmology)    Assessment:   This is a routine wellness examination for Chelsea Maddox.  Exercise Activities and Dietary recommendations Current Exercise Habits: The patient does not  participate in regular exercise at present  Goals    . Increase physical activity     To increase physical activity daily for 30 mins     . Patient Stated     Improve memory, through appts, dietary changes and supplements        Fall Risk Fall Risk  02/01/2019 09/30/2018 03/30/2018 12/20/2017 11/25/2017  Falls in the past year? 0 0 0 0 0  Number falls in past yr: 0 0 0 - -  Injury with Fall? 0 0 - - -   Is the patient's home free of loose throw rugs in walkways, pet beds, electrical cords, etc?   yes      Grab bars in the bathroom? yes      Handrails on the stairs?   yes      Adequate lighting?   yes  Timed Get Up and Go performed: na  Depression Screen PHQ 2/9 Scores 02/01/2019 12/20/2017 11/25/2017  PHQ - 2 Score 0 0 0     Cognitive Function MMSE - Mini Mental State Exam 12/20/2017  Orientation to time 0  Orientation to Place 0  Registration 3  Attention/ Calculation 3  Recall 0  Language- name 2 objects 2  Language- repeat 0  Language- follow 3 step command 3  Language- read & follow direction 1  Write a sentence 0  Copy design 0  Total score 12     6CIT Screen  02/01/2019  What Year? 4 points  What month? 3 points  What time? 3 points  Count back from 20 4 points  Months in reverse 4 points  Repeat phrase 10 points  Total Score 28    Immunization History  Administered Date(s) Administered  . Fluad Quad(high Dose 65+) 09/30/2018  . Influenza, High Dose Seasonal PF 10/20/2017  . Pneumococcal Polysaccharide-23 01/02/2019  . Zoster Recombinat (Shingrix) 01/24/2018    Qualifies for Shingles Vaccine?not completed, recommended   Screening Tests Health Maintenance  Topic Date Due  . TETANUS/TDAP  09/15/1954  . PNA vac Low Risk Adult (2 of 2 - PCV13) 01/02/2020  . INFLUENZA VACCINE  Completed  . DEXA SCAN  Completed    Cancer Screenings: Lung: Low Dose CT Chest recommended if Age 54-80 years, 30 pack-year currently smoking OR have quit w/in 15years. Patient does not qualify. Breast:  Up to date on Mammogram? Yes   Up to date of Bone Density/Dexa? Yes Colorectal: aged out.   Additional Screenings:  Hepatitis C Screening: na     Plan:      I have personally reviewed and noted the following in the patient's chart:   . Medical and social history . Use of alcohol, tobacco or illicit drugs  . Current medications and supplements . Functional ability and status . Nutritional status . Physical activity . Advanced directives . List of other physicians . Hospitalizations, surgeries, and ER visits in previous 12 months . Vitals . Screenings to include cognitive, depression, and falls . Referrals and appointments  In addition, I have reviewed and discussed with patient certain preventive protocols, quality metrics, and best practice recommendations. A written personalized care plan for preventive services as well as general preventive health recommendations were provided to patient.     Sharon Seller, NP  02/01/2019

## 2019-02-01 NOTE — Patient Instructions (Signed)
Chelsea Maddox , Thank you for taking time to come for your Medicare Wellness Visit. I appreciate your ongoing commitment to your health goals. Please review the following plan we discussed and let me know if I can assist you in the future.   Screening recommendations/referrals: Colonoscopy aged out Mammogram aged out Bone Density up to date Recommended yearly ophthalmology/optometry visit for glaucoma screening and checkup Recommended yearly dental visit for hygiene and checkup  Vaccinations: Influenza vaccine up to date Pneumococcal vaccine - up to date Tdap vaccine -NEEDS- recommended to get at the pharmacy  Shingles vaccine -NEEDS- recommended to get at the pharmacy    Advanced directives: on file  Conditions/risks identified: progressive memory loss  Next appointment: 1 year    Preventive Care 25 Years and Older, Female Preventive care refers to lifestyle choices and visits with your health care provider that can promote health and wellness. What does preventive care include?  A yearly physical exam. This is also called an annual well check.  Dental exams once or twice a year.  Routine eye exams. Ask your health care provider how often you should have your eyes checked.  Personal lifestyle choices, including:  Daily care of your teeth and gums.  Regular physical activity.  Eating a healthy diet.  Avoiding tobacco and drug use.  Limiting alcohol use.  Practicing safe sex.  Taking low-dose aspirin every day.  Taking vitamin and mineral supplements as recommended by your health care provider. What happens during an annual well check? The services and screenings done by your health care provider during your annual well check will depend on your age, overall health, lifestyle risk factors, and family history of disease. Counseling  Your health care provider may ask you questions about your:  Alcohol use.  Tobacco use.  Drug use.  Emotional well-being.  Home  and relationship well-being.  Sexual activity.  Eating habits.  History of falls.  Memory and ability to understand (cognition).  Work and work Astronomer.  Reproductive health. Screening  You may have the following tests or measurements:  Height, weight, and BMI.  Blood pressure.  Lipid and cholesterol levels. These may be checked every 5 years, or more frequently if you are over 52 years old.  Skin check.  Lung cancer screening. You may have this screening every year starting at age 81 if you have a 30-pack-year history of smoking and currently smoke or have quit within the past 15 years.  Fecal occult blood test (FOBT) of the stool. You may have this test every year starting at age 64.  Flexible sigmoidoscopy or colonoscopy. You may have a sigmoidoscopy every 5 years or a colonoscopy every 10 years starting at age 19.  Hepatitis C blood test.  Hepatitis B blood test.  Sexually transmitted disease (STD) testing.  Diabetes screening. This is done by checking your blood sugar (glucose) after you have not eaten for a while (fasting). You may have this done every 1-3 years.  Bone density scan. This is done to screen for osteoporosis. You may have this done starting at age 62.  Mammogram. This may be done every 1-2 years. Talk to your health care provider about how often you should have regular mammograms. Talk with your health care provider about your test results, treatment options, and if necessary, the need for more tests. Vaccines  Your health care provider may recommend certain vaccines, such as:  Influenza vaccine. This is recommended every year.  Tetanus, diphtheria, and acellular pertussis (Tdap, Td)  vaccine. You may need a Td booster every 10 years.  Zoster vaccine. You may need this after age 74.  Pneumococcal 13-valent conjugate (PCV13) vaccine. One dose is recommended after age 27.  Pneumococcal polysaccharide (PPSV23) vaccine. One dose is recommended  after age 14. Talk to your health care provider about which screenings and vaccines you need and how often you need them. This information is not intended to replace advice given to you by your health care provider. Make sure you discuss any questions you have with your health care provider. Document Released: 01/25/2015 Document Revised: 09/18/2015 Document Reviewed: 10/30/2014 Elsevier Interactive Patient Education  2017 [place] Prevention in the Home Falls can cause injuries. They can happen to people of all ages. There are many things you can do to make your home safe and to help prevent falls. What can I do on the outside of my home?  Regularly fix the edges of walkways and driveways and fix any cracks.  Remove anything that might make you trip as you walk through a door, such as a raised step or threshold.  Trim any bushes or trees on the path to your home.  Use bright outdoor lighting.  Clear any walking paths of anything that might make someone trip, such as rocks or tools.  Regularly check to see if handrails are loose or broken. Make sure that both sides of any steps have handrails.  Any raised decks and porches should have guardrails on the edges.  Have any leaves, snow, or ice cleared regularly.  Use sand or salt on walking paths during winter.  Clean up any spills in your garage right away. This includes oil or grease spills. What can I do in the bathroom?  Use night lights.  Install grab bars by the toilet and in the tub and shower. Do not use towel bars as grab bars.  Use non-skid mats or decals in the tub or shower.  If you need to sit down in the shower, use a plastic, non-slip stool.  Keep the floor dry. Clean up any water that spills on the floor as soon as it happens.  Remove soap buildup in the tub or shower regularly.  Attach bath mats securely with double-sided non-slip rug tape.  Do not have throw rugs and other things on the floor  that can make you trip. What can I do in the bedroom?  Use night lights.  Make sure that you have a light by your bed that is easy to reach.  Do not use any sheets or blankets that are too big for your bed. They should not hang down onto the floor.  Have a firm chair that has side arms. You can use this for support while you get dressed.  Do not have throw rugs and other things on the floor that can make you trip. What can I do in the kitchen?  Clean up any spills right away.  Avoid walking on wet floors.  Keep items that you use a lot in easy-to-reach places.  If you need to reach something above you, use a strong step stool that has a grab bar.  Keep electrical cords out of the way.  Do not use floor polish or wax that makes floors slippery. If you must use wax, use non-skid floor wax.  Do not have throw rugs and other things on the floor that can make you trip. What can I do with my stairs?  Do not leave  any items on the stairs.  Make sure that there are handrails on both sides of the stairs and use them. Fix handrails that are broken or loose. Make sure that handrails are as long as the stairways.  Check any carpeting to make sure that it is firmly attached to the stairs. Fix any carpet that is loose or worn.  Avoid having throw rugs at the top or bottom of the stairs. If you do have throw rugs, attach them to the floor with carpet tape.  Make sure that you have a light switch at the top of the stairs and the bottom of the stairs. If you do not have them, ask someone to add them for you. What else can I do to help prevent falls?  Wear shoes that:  Do not have high heels.  Have rubber bottoms.  Are comfortable and fit you well.  Are closed at the toe. Do not wear sandals.  If you use a stepladder:  Make sure that it is fully opened. Do not climb a closed stepladder.  Make sure that both sides of the stepladder are locked into place.  Ask someone to hold it  for you, if possible.  Clearly mark and make sure that you can see:  Any grab bars or handrails.  First and last steps.  Where the edge of each step is.  Use tools that help you move around (mobility aids) if they are needed. These include:  Canes.  Walkers.  Scooters.  Crutches.  Turn on the lights when you go into a dark area. Replace any light bulbs as soon as they burn out.  Set up your furniture so you have a clear path. Avoid moving your furniture around.  If any of your floors are uneven, fix them.  If there are any pets around you, be aware of where they are.  Review your medicines with your doctor. Some medicines can make you feel dizzy. This can increase your chance of falling. Ask your doctor what other things that you can do to help prevent falls. This information is not intended to replace advice given to you by your health care provider. Make sure you discuss any questions you have with your health care provider. Document Released: 10/25/2008 Document Revised: 06/06/2015 Document Reviewed: 02/02/2014 Elsevier Interactive Patient Education  2017 [employer].

## 2019-02-01 NOTE — Progress Notes (Signed)
   This service is provided via telemedicine  No vital signs collected/recorded due to the encounter was a telemedicine visit.   Location of patient (ex: home, work):  Home   Patient consents to a telephone visit:  Yes  Location of the provider (ex: office, home):  [facility], Office   Name of any referring provider:  N/A  Names of all persons participating in the telemedicine service and their role in the encounter:  S.Chrae B/CMA, Jessica Eubanks, NP, and Patient   Time spent on call:  9 min with medical assistant  

## 2019-02-14 NOTE — Telephone Encounter (Signed)
Opened in error

## 2019-02-24 NOTE — Telephone Encounter (Signed)
Per Shanda Bumps.  Call [employer] #[phone] and tell them to stop sending the [employer] form. Daughter nor patient wants done.   Called and spoke with Tish and she stated that she will cancel the order and stop faxing.

## 2019-03-29 ENCOUNTER — Encounter: Payer: Self-pay | Admitting: Nurse Practitioner

## 2019-03-29 ENCOUNTER — Ambulatory Visit (INDEPENDENT_AMBULATORY_CARE_PROVIDER_SITE_OTHER): Payer: Medicare HMO | Admitting: Nurse Practitioner

## 2019-03-29 ENCOUNTER — Other Ambulatory Visit: Payer: Self-pay

## 2019-03-29 VITALS — BP 128/70 | HR 62 | Temp 96.6°F | Ht 64.0 in | Wt 153.2 lb

## 2019-03-29 DIAGNOSIS — R5381 Other malaise: Secondary | ICD-10-CM | POA: Diagnosis not present

## 2019-03-29 DIAGNOSIS — F419 Anxiety disorder, unspecified: Secondary | ICD-10-CM | POA: Diagnosis not present

## 2019-03-29 DIAGNOSIS — I10 Essential (primary) hypertension: Secondary | ICD-10-CM | POA: Diagnosis not present

## 2019-03-29 DIAGNOSIS — B372 Candidiasis of skin and nail: Secondary | ICD-10-CM | POA: Diagnosis not present

## 2019-03-29 DIAGNOSIS — E785 Hyperlipidemia, unspecified: Secondary | ICD-10-CM | POA: Diagnosis not present

## 2019-03-29 DIAGNOSIS — R69 Illness, unspecified: Secondary | ICD-10-CM | POA: Diagnosis not present

## 2019-03-29 DIAGNOSIS — N1831 Chronic kidney disease, stage 3a: Secondary | ICD-10-CM | POA: Diagnosis not present

## 2019-03-29 DIAGNOSIS — F0391 Unspecified dementia with behavioral disturbance: Secondary | ICD-10-CM | POA: Diagnosis not present

## 2019-03-29 DIAGNOSIS — E559 Vitamin D deficiency, unspecified: Secondary | ICD-10-CM

## 2019-03-29 MED ORDER — NYSTATIN 100000 UNIT/GM EX POWD: 1.0000 "application " | Freq: Three times a day (TID) | CUTANEOUS | 2 refills | Status: AC

## 2019-03-29 NOTE — Progress Notes (Signed)
Careteam: Patient Care Team: Sharon Seller, NP as PCP - General (Geriatric Medicine) Judi Saa, DO as Consulting Physician (Family Medicine) Eber Jones, MD as Referring Physician (Ophthalmology)  PLACE OF SERVICE:  [facility]  Advanced Directive information    No Known Allergies  Chief Complaint  Patient presents with  . Medical Management of Chronic Issues    6 month follow-up. Here with daughter, [name]   . Rash    Rash under right breast, ongoing      HPI: Patient is a 84 y.o. female for routine follow up.   noctual hypoxia- she continues to use oxygen at night, not consistent however she did not re-quaify on last test   Dementia- increase in confusion and delusion. On lexapro for anxiety. Worse at night.  Will wake up in the middle of the night and not know where she is or who her husband is.  Using a calm drink that helps her.  Would like to make sure that kidney function and electrolytes are not out of balance.  Reports they have made some headway with nutrition.  namenda and aricept have been stopped due to no longer providing benefit.   Eating well at this time. Having a hard time getting fluids in.  Daughter got her IV hydration drink to help with fluid status  Having trouble with mobility. Has rolled out of the bed twice  Unable to get up if she falls down. Increase in weakness and debility.  She does walk in the house. Roams the house.  Needing help with bathing and tolieting  Does not use utensil well. Help with feeding.    Review of Systems:  Review of Systems  Unable to perform ROS: Dementia    Past Medical History:  Diagnosis Date  . Allergy   . Arthritis   . Chicken pox   . COPD (chronic obstructive pulmonary disease) (HCC)   . Dementia (HCC)   . Dementia without behavioral disturbance (HCC)    unspecified type  . DJD (degenerative joint disease)   . Generalized headaches   . Hyperlipidemia   . Hypertension   .  Hypertensive retinopathy of both eyes   . Kidney disease   . Low back pain   . Macular degeneration   . Major depressive disorder   . Neuropathy   . Nuclear sclerotic cataract of both eyes   . Osteoarthritis   . Peripheral neuropathy   . Retinal edema   . Thyroid disease   . UTI (urinary tract infection)   . Vertigo   . Vitamin D deficiency    Past Surgical History:  Procedure Laterality Date  . ABDOMINAL HYSTERECTOMY    . COLONOSCOPY  2012  . SACRAL NERVE STIMULATOR PLACEMENT  2018  . TRIGGER FINGER RELEASE     Social History:   reports that she has never smoked. She has never used smokeless tobacco. She reports that she does not drink alcohol or use drugs.  Family History  Problem Relation Age of Onset  . Arthritis Mother   . Diabetes Mother   . Hearing loss Mother   . Hyperlipidemia Mother   . Hypertension Mother   . Aneurysm Mother   . Arthritis Father   . COPD Father   . Hearing loss Father   . Heart disease Father   . Hyperlipidemia Father   . Hypertension Father   . Kidney disease Father   . Anuerysm Father  aortic arch  . Dementia Father   . Arthritis Sister 50  . Hypertension Sister   . Macular degeneration Sister   . Aneurysm Sister        aortic  . Arthritis Sister   . Hearing loss Sister   . Hypertension Sister   . Aneurysm Sister        brain  . Early death Brother 78  . Hyperlipidemia Brother   . Hypertension Brother   . Aneurysm Brother        aortic  . Arthritis Brother   . Hypertension Brother   . Gout Brother   . Macular degeneration Brother   . Aneurysm Brother        aortic  . Other Brother 18       [place]  . Hyperlipidemia Daughter 79  . Hypertension Daughter   . Depression Daughter   . Arthritis Daughter   . Arthritis Daughter   . Hypertension Son     Medications: Patient's Medications  New Prescriptions   No medications on file  Previous Medications   ALBUTEROL (PROVENTIL HFA;VENTOLIN HFA) 108 (90 BASE)  MCG/ACT INHALER    Inhale 1-2 puffs into the lungs every 6 (six) hours as needed for wheezing or shortness of breath.   CHOLECALCIFEROL 125 MCG (5000 UT) CAPSULE    Take 5,000 Units by mouth daily.   ESCITALOPRAM (LEXAPRO) 10 MG TABLET    TAKE 1 TABLET BY MOUTH EVERY DAY   LOSARTAN-HYDROCHLOROTHIAZIDE (HYZAAR) 100-12.5 MG TABLET    TAKE 1 TABLET BY MOUTH EVERY DAY   MELATONIN 5 MG CAPS    Take 1 capsule by mouth at bedtime.   MISC NATURAL PRODUCTS (TART CHERRY ADVANCED PO)    Take 3,000 mg by mouth daily.   MULTIPLE VITAMINS-MINERALS (PRESERVISION AREDS PO)    Take 1 capsule by mouth 2 (two) times daily.   OXYGEN    Inhale 2 L into the lungs at bedtime.   UNABLE TO FIND    Med Name: CBD Oil, 1/2 drop twice daily   UNABLE TO FIND    Take 2 capsules by mouth daily. Med Name: Bertrum Sol  Modified Medications   No medications on file  Discontinued Medications   CHOLECALCIFEROL (VITAMIN D3) 50 MCG (2000 UT) TABS    Take 1 tablet by mouth daily.   MAGNESIUM 400 MG CAPS    Take 1 tablet by mouth daily.   MAGNESIUM 500 MG CAPS    Take 1 capsule by mouth daily.   UNABLE TO FIND    Med Name: Mushroom drop daily for memory    Physical Exam:  Vitals:   03/29/19 1336  BP: 128/70  Pulse: 62  Temp: (!) 96.6 F (35.9 C)  TempSrc: Temporal  SpO2: 97%  Weight: 153 lb 3.2 oz (69.5 kg)  Height: 5\' 4"  (1.626 m)   Body mass index is 26.3 kg/m. Wt Readings from Last 3 Encounters:  03/29/19 153 lb 3.2 oz (69.5 kg)  09/30/18 152 lb (68.9 kg)  05/09/18 147 lb (66.7 kg)    Physical Exam Constitutional:      General: She is not in acute distress.    Appearance: She is well-developed. She is not diaphoretic.  HENT:     Head: Normocephalic and atraumatic.     Mouth/Throat:     Pharynx: No oropharyngeal exudate.  Eyes:     Conjunctiva/sclera: Conjunctivae normal.     Pupils: Pupils are equal, round, and reactive to light.  Cardiovascular:  Rate and Rhythm: Normal rate and regular rhythm.      Heart sounds: Normal heart sounds.  Pulmonary:     Effort: Pulmonary effort is normal.     Breath sounds: Normal breath sounds.  Abdominal:     General: Bowel sounds are normal.     Palpations: Abdomen is soft.  Musculoskeletal:        General: No tenderness.     Cervical back: Normal range of motion and neck supple.  Skin:    General: Skin is warm and dry.  Neurological:     Mental Status: She is alert and oriented to person, place, and time.    Labs reviewed: Basic Metabolic Panel: Recent Labs    03/30/18 1255 05/09/18 1206 09/30/18 1610  NA 142  --  140  K 3.3*  --  3.9  CL 104  --  104  CO2 31  --  29  GLUCOSE 101  --  102  BUN 15  --  17  CREATININE 0.96*  --  1.02*  CALCIUM 9.3 9.6 9.6   Liver Function Tests: Recent Labs    09/30/18 1610  AST 16  ALT 11  BILITOT 0.5  PROT 7.0   No results for input(s): LIPASE, AMYLASE in the last 8760 hours. No results for input(s): AMMONIA in the last 8760 hours. CBC: Recent Labs    09/30/18 1610  WBC 6.7  NEUTROABS 4,449  HGB 12.0  HCT 37.0  MCV 89.4  PLT 248   Lipid Panel: No results for input(s): CHOL, HDL, LDLCALC, TRIG, CHOLHDL, LDLDIRECT in the last 8760 hours. TSH: No results for input(s): TSH in the last 8760 hours. A1C: No results found for: HGBA1C   Assessment/Plan 1. Dementia with behavioral disturbance, unspecified dementia type ([place]) -ongoing decline, relies on family for assistance with ADLs, now with increase in paranoia and delusions. Also with increase in sun downers and waking up at night not knowing her husband.  -will follow up labs to ensure normal. Discussed with daughter about starting Seroquel. Risk and benefits reviewed. Will await lab results.   - Vitamin B12 - CBC with Differential/Platelet - COMPLETE METABOLIC PANEL WITH GFR - TSH  2. Anxiety -continues on lexapro for anxiety. Having more behaviors and paranoia related to dementia.   3. Stage 3a chronic kidney  disease -Encourage proper hydration and to avoid NSAIDS (Aleve, Advil, Motrin, Ibuprofen), dose adjust medication as needed   4. Vitamin D deficiency -continues on supplement.  - Vitamin D, 25-hydroxy  5. Debility -progressive decline in mobility and debility, would benefit from assistive device and PT evaluate, daughter would like to hold off at this time as they are planning on moving pt into small home at her daughter house  -handicap placard completed.   6. Yeast infection of the skin -under breast, used nystatin cream in the past.  - nystatin (MYCOSTATIN/NYSTOP) powder; Apply 1 application topically 3 (three) times daily.  Dispense: 45 g; Refill: 2  7. Essential hypertension Controlled on losartan-hctz  8. Hyperlipidemia, unspecified hyperlipidemia type Daughter does not wish to follow, following the the bredesen theory and reports it is felt like the brain does better with increase in cholesterol.   Next appt: 6 months Bogdan Vivona K. [place], [facility] [phone]

## 2019-03-30 DIAGNOSIS — R69 Illness, unspecified: Secondary | ICD-10-CM | POA: Diagnosis not present

## 2019-03-30 LAB — CBC WITH DIFFERENTIAL/PLATELET
Absolute Monocytes: 624 cells/uL (ref 200–950)
Basophils Absolute: 13 cells/uL (ref 0–200)
Basophils Relative: 0.2 %
Eosinophils Absolute: 130 cells/uL (ref 15–500)
Eosinophils Relative: 2 %
HCT: 36.1 % (ref 35.0–45.0)
Hemoglobin: 11.7 g/dL (ref 11.7–15.5)
Lymphs Abs: 1021 cells/uL (ref 850–3900)
MCH: 28.9 pg (ref 27.0–33.0)
MCHC: 32.4 g/dL (ref 32.0–36.0)
MCV: 89.1 fL (ref 80.0–100.0)
MPV: 10 fL (ref 7.5–12.5)
Monocytes Relative: 9.6 %
Neutro Abs: 4713 cells/uL (ref 1500–7800)
Neutrophils Relative %: 72.5 %
Platelets: 262 10*3/uL (ref 140–400)
RBC: 4.05 10*6/uL (ref 3.80–5.10)
RDW: 12.2 % (ref 11.0–15.0)
Total Lymphocyte: 15.7 %
WBC: 6.5 10*3/uL (ref 3.8–10.8)

## 2019-03-30 LAB — COMPLETE METABOLIC PANEL WITH GFR
AG Ratio: 1.5 (calc) (ref 1.0–2.5)
ALT: 9 U/L (ref 6–29)
AST: 15 U/L (ref 10–35)
Albumin: 4.1 g/dL (ref 3.6–5.1)
Alkaline phosphatase (APISO): 98 U/L (ref 37–153)
BUN/Creatinine Ratio: 17 (calc) (ref 6–22)
BUN: 21 mg/dL (ref 7–25)
CO2: 30 mmol/L (ref 20–32)
Calcium: 9.6 mg/dL (ref 8.6–10.4)
Chloride: 106 mmol/L (ref 98–110)
Creat: 1.24 mg/dL — ABNORMAL HIGH (ref 0.60–0.88)
GFR, Est African American: 47 mL/min/{1.73_m2} — ABNORMAL LOW (ref >=60)
GFR, Est Non African American: 41 mL/min/{1.73_m2} — ABNORMAL LOW (ref >=60)
Globulin: 2.7 g/dL (calc) (ref 1.9–3.7)
Glucose, Bld: 92 mg/dL (ref 65–139)
Potassium: 4.3 mmol/L (ref 3.5–5.3)
Sodium: 142 mmol/L (ref 135–146)
Total Bilirubin: 0.6 mg/dL (ref 0.2–1.2)
Total Protein: 6.8 g/dL (ref 6.1–8.1)

## 2019-03-30 LAB — VITAMIN B12: Vitamin B-12: 371 pg/mL (ref 200–1100)

## 2019-03-30 LAB — TSH: TSH: 2.25 mIU/L (ref 0.40–4.50)

## 2019-03-30 LAB — VITAMIN D 25 HYDROXY (VIT D DEFICIENCY, FRACTURES): Vit D, 25-Hydroxy: 37 ng/mL (ref 30–100)

## 2019-04-07 ENCOUNTER — Other Ambulatory Visit: Payer: Self-pay | Admitting: Nurse Practitioner

## 2019-04-07 NOTE — Telephone Encounter (Signed)
rx sent to pharmacy by e-script  

## 2019-04-11 ENCOUNTER — Other Ambulatory Visit: Payer: Self-pay | Admitting: Nurse Practitioner

## 2019-04-13 ENCOUNTER — Other Ambulatory Visit: Payer: Self-pay | Admitting: Nurse Practitioner

## 2019-04-28 ENCOUNTER — Encounter: Payer: Self-pay | Admitting: Nurse Practitioner

## 2019-05-10 ENCOUNTER — Ambulatory Visit: Payer: Medicare HMO | Admitting: Family Medicine

## 2019-05-10 ENCOUNTER — Other Ambulatory Visit: Payer: Self-pay

## 2019-05-10 ENCOUNTER — Encounter: Payer: Self-pay | Admitting: Family Medicine

## 2019-05-10 VITALS — BP 120/82 | HR 73 | Ht 64.0 in

## 2019-05-10 DIAGNOSIS — F0391 Unspecified dementia with behavioral disturbance: Secondary | ICD-10-CM

## 2019-05-10 DIAGNOSIS — M170 Bilateral primary osteoarthritis of knee: Secondary | ICD-10-CM | POA: Diagnosis not present

## 2019-05-10 DIAGNOSIS — R69 Illness, unspecified: Secondary | ICD-10-CM | POA: Diagnosis not present

## 2019-05-10 NOTE — Patient Instructions (Signed)
Will get approval for gel injections Injected both knees today See me in 6-8 weeks

## 2019-05-10 NOTE — Progress Notes (Signed)
[place] Phone: [phone] Subjective:    I'm seeing this patient by the request  of:  Lauree Chandler, NP  CC: Bilateral knee pain  JSE:[id number]  Chelsea Maddox is a 84 y.o. female coming in with complaint of bilateral knee pain. Last seen on 05/09/2018 for injections. Patient's daughter states that patient did have relief from last injections.  Patient is having warm bilateral knee pain again.  Patient is accompanied with daughter secondary to her dementia.  Patient's daughter states and has been having more difficulty with even transferring at the moment.      Past Medical History:  Diagnosis Date  . Allergy   . Arthritis   . Chicken pox   . COPD (chronic obstructive pulmonary disease) ([place])   . Dementia ([place])   . Dementia without behavioral disturbance ([place])    unspecified type  . DJD (degenerative joint disease)   . Generalized headaches   . Hyperlipidemia   . Hypertension   . Hypertensive retinopathy of both eyes   . Kidney disease   . Low back pain   . Macular degeneration   . Major depressive disorder   . Neuropathy   . Nuclear sclerotic cataract of both eyes   . Osteoarthritis   . Peripheral neuropathy   . Retinal edema   . Thyroid disease   . UTI (urinary tract infection)   . Vertigo   . Vitamin D deficiency    Past Surgical History:  Procedure Laterality Date  . ABDOMINAL HYSTERECTOMY    . COLONOSCOPY  2012  . SACRAL NERVE STIMULATOR PLACEMENT  2018  . TRIGGER FINGER RELEASE     Social History   Socioeconomic History  . Marital status: Married    Spouse name: Not on file  . Number of children: 3  . Years of education: Not on file  . Highest education level: Not on file  Occupational History  . Not on file  Tobacco Use  . Smoking status: Never Smoker  . Smokeless tobacco: Never Used  Substance and Sexual Activity  . Alcohol use: Never  . Drug use: Never  . Sexual  activity: Not Currently  Other Topics Concern  . Not on file  Social History Narrative   Social History      Diet?       Do you drink/eat things with caffeine? yes      Marital status?        married                            What year were you married? 1957      Do you live in a house, apartment, assisted living, condo, trailer, etc.? house      Is it one or more stories? 1      How many persons live in your home? 4      Do you have any pets in your home? (please list) 3 dogs, 1 cat      Highest level of education completed? Some college      Current or past profession: homemaker      Do you exercise?       no                               Type & how often?  Advanced Directives      Do you have a living will? yes      Do you have a DNR form?                                  If not, do you want to discuss one? no      Do you have signed POA/HPOA for forms? yes      Functional Status      Do you have difficulty bathing or dressing yourself? yes      Do you have difficulty preparing food or eating?  yes      Do you have difficulty managing your medications? yes      Do you have difficulty managing your finances? yes      Do you have difficulty affording your medications? no   Social Determinants of Health   Financial Resource Strain:   . Difficulty of Paying Living Expenses:   Food Insecurity:   . Worried About Programme researcher, broadcasting/film/video in the Last Year:   . Barista in the Last Year:   Transportation Needs:   . Freight forwarder (Medical):   Marland Kitchen Lack of Transportation (Non-Medical):   Physical Activity:   . Days of Exercise per Week:   . Minutes of Exercise per Session:   Stress:   . Feeling of Stress :   Social Connections:   . Frequency of Communication with Friends and Family:   . Frequency of Social Gatherings with Friends and Family:   . Attends Religious Services:   . Active Member of Clubs or Organizations:   . Attends Tax inspector Meetings:   Marland Kitchen Marital Status:    No Known Allergies Family History  Problem Relation Age of Onset  . Arthritis Mother   . Diabetes Mother   . Hearing loss Mother   . Hyperlipidemia Mother   . Hypertension Mother   . Aneurysm Mother   . Arthritis Father   . COPD Father   . Hearing loss Father   . Heart disease Father   . Hyperlipidemia Father   . Hypertension Father   . Kidney disease Father   . Anuerysm Father        aortic arch  . Dementia Father   . Arthritis Sister 59  . Hypertension Sister   . Macular degeneration Sister   . Aneurysm Sister        aortic  . Arthritis Sister   . Hearing loss Sister   . Hypertension Sister   . Aneurysm Sister        brain  . Early death Brother 77  . Hyperlipidemia Brother   . Hypertension Brother   . Aneurysm Brother        aortic  . Arthritis Brother   . Hypertension Brother   . Gout Brother   . Macular degeneration Brother   . Aneurysm Brother        aortic  . Other Brother 6       [place]  . Hyperlipidemia Daughter 55  . Hypertension Daughter   . Depression Daughter   . Arthritis Daughter   . Arthritis Daughter   . Hypertension Son      Current Outpatient Medications (Cardiovascular):  .  losartan-hydrochlorothiazide (HYZAAR) 100-12.5 MG tablet, TAKE 1 TABLET BY MOUTH EVERY DAY  Current Outpatient Medications (Respiratory):  .  albuterol (PROVENTIL HFA;VENTOLIN HFA)  108 (90 Base) MCG/ACT inhaler, Inhale 1-2 puffs into the lungs every 6 (six) hours as needed for wheezing or shortness of breath.    Current Outpatient Medications (Other):  Marland Kitchen  Cholecalciferol 125 MCG (5000 UT) capsule, Take 5,000 Units by mouth daily. Marland Kitchen  escitalopram (LEXAPRO) 10 MG tablet, TAKE ONE TABLET BY MOUTH ONE TIME DAILY  .  Melatonin 5 MG CAPS, Take 1 capsule by mouth at bedtime. .  Misc Natural Products (TART CHERRY ADVANCED PO), Take 3,000 mg by mouth daily. .  Multiple Vitamins-Minerals (PRESERVISION AREDS PO), Take 1  capsule by mouth 2 (two) times daily. Marland Kitchen  nystatin (MYCOSTATIN/NYSTOP) powder, Apply 1 application topically 3 (three) times daily. .  OXYGEN, Inhale 2 L into the lungs at bedtime. Marland Kitchen  UNABLE TO FIND, Med Name: CBD Oil, 1/2 drop twice daily .  UNABLE TO FIND, Take 2 capsules by mouth daily. Med Name: Bertrum Sol   Reviewed prior external information including notes and imaging from  primary care provider As well as notes that were available from care everywhere and other healthcare systems.  Past medical history, social, surgical and family history all reviewed in electronic medical record.  No pertanent information unless stated regarding to the chief complaint.   Review of Systems:  No headache, visual changes, nausea, vomiting, diarrhea, constipation, dizziness, abdominal pain, skin rash, fevers, chills, night sweats, weight loss, swollen lymph nodes, , joint swelling, chest pain, shortness of breath, mood changes. POSITIVE muscle aches, body aches  Objective  Blood pressure 120/82, pulse 73, height 5\' 4"  (1.626 m), SpO2 91 %.   General: No apparent distress alert but not oriented HEENT: Pupils equal, extraocular movements intact  Respiratory: Patient's speak in full sentences and does not appear short of breath  Cardiovascular: Trace lower extremity edema, non tender, no erythema  Neuro: Cranial nerves II through XII are intact, neurovascularly intact in all extremities with 2+ DTRs and 2+ pulses.  Gait antalgic Knee: Bilateral valgus deformity noted.  Abnormal thigh to calf ratio.  Tender to palpation over medial and PF joint line.  ROM full in flexion and extension and lower leg rotation. instability with valgus force.  painful patellar compression. Patellar glide with moderate crepitus. Patellar and quadriceps tendons unremarkable. Hamstring and quadriceps strength is normal.  After informed written and verbal consent, patient was seated on exam table. Right knee was prepped with  alcohol swab and utilizing anterolateral approach, patient's right knee space was injected with 4:1  marcaine 0.5%: Kenalog 40mg /dL. Patient tolerated the procedure well without immediate complications.  After informed written and verbal consent, patient was seated on exam table. Left knee was prepped with alcohol swab and utilizing anterolateral approach, patient's left knee space was injected with 4:1  marcaine 0.5%: Kenalog 40mg /dL. Patient tolerated the procedure well without immediate complications.   Impression and Recommendations:     This case required medical decision making of moderate complexity. The above documentation has been reviewed and is accurate and complete , DO       Note: This dictation was prepared with Dragon dictation along with smaller phrase technology. Any transcriptional errors that result from this process are unintentional.

## 2019-05-10 NOTE — Assessment & Plan Note (Signed)
Chronic problem with worsening symptoms.  Social determinants of health includes patient with near end-stage dementia that makes it very difficult.  Concern for anything such as oral anti-inflammatories on a regular basis with so we will try topical.  Discussed the over-the-counter medications.  Patient did respond very well to viscosupplementation previously and hopefully will be beneficial again.  Discussed icing regimen.  Follow-up again in 4 to 8 weeks

## 2019-05-17 ENCOUNTER — Encounter: Payer: Self-pay | Admitting: Nurse Practitioner

## 2019-05-18 ENCOUNTER — Encounter: Payer: Self-pay | Admitting: Nurse Practitioner

## 2019-05-18 MED ORDER — QUETIAPINE FUMARATE 25 MG PO TABS: 25.0000 mg | ORAL_TABLET | Freq: Every day | ORAL | 1 refills | Status: DC

## 2019-06-07 ENCOUNTER — Encounter: Payer: Self-pay | Admitting: Family Medicine

## 2019-06-09 DIAGNOSIS — R69 Illness, unspecified: Secondary | ICD-10-CM | POA: Diagnosis not present

## 2019-06-23 ENCOUNTER — Ambulatory Visit: Payer: Medicare HMO | Admitting: Family Medicine

## 2019-06-23 ENCOUNTER — Other Ambulatory Visit: Payer: Self-pay

## 2019-06-23 ENCOUNTER — Encounter: Payer: Self-pay | Admitting: Family Medicine

## 2019-06-23 VITALS — BP 122/64 | HR 57 | Ht 64.0 in | Wt 152.0 lb

## 2019-06-23 DIAGNOSIS — M170 Bilateral primary osteoarthritis of knee: Secondary | ICD-10-CM

## 2019-06-23 DIAGNOSIS — F0391 Unspecified dementia with behavioral disturbance: Secondary | ICD-10-CM

## 2019-06-23 DIAGNOSIS — R69 Illness, unspecified: Secondary | ICD-10-CM | POA: Diagnosis not present

## 2019-06-23 NOTE — Patient Instructions (Signed)
See me again in 3 months.

## 2019-06-23 NOTE — Progress Notes (Addendum)
[name] [address] [place] [address] Phone: [phone] Subjective:   Chelsea Maddox, am serving as a scribe for Dr. Antoine Primas. This visit occurred during the SARS-CoV-2 public health emergency.  Safety protocols were in place, including screening questions prior to the visit, additional usage of staff PPE, and extensive cleaning of exam room while observing appropriate contact time as indicated for disinfecting solutions.   I'm seeing this patient by the request  of:  Sharon Seller, NP  CC: Bilateral knee pain follow-up [id number]   05/10/2019 Chronic problem with worsening symptoms.  Social determinants of health includes patient with near end-stage dementia that makes it very difficult.  Concern for anything such as oral anti-inflammatories on a regular basis with so we will try topical.  Discussed the over-the-counter medications.  Patient did respond very well to viscosupplementation previously and hopefully will be beneficial again.  Discussed icing regimen.  Follow-up again in 4 to 8 weeks  Update 06/23/2019 Carisha Kantor is a 84 y.o. female coming in with complaint of bilateral knee pain. Monovisc.  Patient does have dementia and level 5 caveat.  Patient is accompanied with daughter though.  Daughter states that the steroid injection was helpful.  Has been working for about 2 weeks and then she has noticed that her mom has stopped doing as much activity again.  Rates the severity of pain is 8 out of 10.     Past Medical History:  Diagnosis Date  . Allergy   . Arthritis   . Chicken pox   . COPD (chronic obstructive pulmonary disease) (HCC)   . Dementia (HCC)   . Dementia without behavioral disturbance (HCC)    unspecified type  . DJD (degenerative joint disease)   . Generalized headaches   . Hyperlipidemia   . Hypertension   . Hypertensive retinopathy of both eyes   . Kidney disease   . Low back pain   . Macular  degeneration   . Major depressive disorder   . Neuropathy   . Nuclear sclerotic cataract of both eyes   . Osteoarthritis   . Peripheral neuropathy   . Retinal edema   . Thyroid disease   . UTI (urinary tract infection)   . Vertigo   . Vitamin D deficiency    Past Surgical History:  Procedure Laterality Date  . ABDOMINAL HYSTERECTOMY    . COLONOSCOPY  2012  . SACRAL NERVE STIMULATOR PLACEMENT  2018  . TRIGGER FINGER RELEASE     Social History   Socioeconomic History  . Marital status: Married    Spouse name: Not on file  . Number of children: 3  . Years of education: Not on file  . Highest education level: Not on file  Occupational History  . Not on file  Tobacco Use  . Smoking status: Never Smoker  . Smokeless tobacco: Never Used  Vaping Use  . Vaping Use: Never used  Substance and Sexual Activity  . Alcohol use: Never  . Drug use: Never  . Sexual activity: Not Currently  Other Topics Concern  . Not on file  Social History Narrative   Social History      Diet?       Do you drink/eat things with caffeine? yes      Marital status?        married  What year were you married? 1957      Do you live in a house, apartment, assisted living, condo, trailer, etc.? house      Is it one or more stories? 1      How many persons live in your home? 4      Do you have any pets in your home? (please list) 3 dogs, 1 cat      Highest level of education completed? Some college      Current or past profession: homemaker      Do you exercise?       no                               Type & how often?      Advanced Directives      Do you have a living will? yes      Do you have a DNR form?                                  If not, do you want to discuss one? no      Do you have signed POA/HPOA for forms? yes      Functional Status      Do you have difficulty bathing or dressing yourself? yes      Do you have difficulty preparing food or  eating?  yes      Do you have difficulty managing your medications? yes      Do you have difficulty managing your finances? yes      Do you have difficulty affording your medications? no   Social Determinants of Health   Financial Resource Strain:   . Difficulty of Paying Living Expenses:   Food Insecurity:   . Worried About Programme researcher, broadcasting/film/video in the Last Year:   . Barista in the Last Year:   Transportation Needs:   . Freight forwarder (Medical):   Marland Kitchen Lack of Transportation (Non-Medical):   Physical Activity:   . Days of Exercise per Week:   . Minutes of Exercise per Session:   Stress:   . Feeling of Stress :   Social Connections:   . Frequency of Communication with Friends and Family:   . Frequency of Social Gatherings with Friends and Family:   . Attends Religious Services:   . Active Member of Clubs or Organizations:   . Attends Banker Meetings:   Marland Kitchen Marital Status:    No Known Allergies Family History  Problem Relation Age of Onset  . Arthritis Mother   . Diabetes Mother   . Hearing loss Mother   . Hyperlipidemia Mother   . Hypertension Mother   . Aneurysm Mother   . Arthritis Father   . COPD Father   . Hearing loss Father   . Heart disease Father   . Hyperlipidemia Father   . Hypertension Father   . Kidney disease Father   . Anuerysm Father        aortic arch  . Dementia Father   . Arthritis Sister 105  . Hypertension Sister   . Macular degeneration Sister   . Aneurysm Sister        aortic  . Arthritis Sister   . Hearing loss Sister   . Hypertension Sister   . Aneurysm Sister  brain  . Early death Brother 37  . Hyperlipidemia Brother   . Hypertension Brother   . Aneurysm Brother        aortic  . Arthritis Brother   . Hypertension Brother   . Gout Brother   . Macular degeneration Brother   . Aneurysm Brother        aortic  . Other Brother 2       [employer] [place]  . Hyperlipidemia Daughter 38  . Hypertension  Daughter   . Depression Daughter   . Arthritis Daughter   . Arthritis Daughter   . Hypertension Son      Current Outpatient Medications (Cardiovascular):  .  losartan-hydrochlorothiazide (HYZAAR) 100-12.5 MG tablet, TAKE 1 TABLET BY MOUTH EVERY DAY  Current Outpatient Medications (Respiratory):  .  albuterol (PROVENTIL HFA;VENTOLIN HFA) 108 (90 Base) MCG/ACT inhaler, Inhale 1-2 puffs into the lungs every 6 (six) hours as needed for wheezing or shortness of breath.    Current Outpatient Medications (Other):  Marland Kitchen  Cholecalciferol 125 MCG (5000 UT) capsule, Take 5,000 Units by mouth daily. Marland Kitchen  escitalopram (LEXAPRO) 10 MG tablet, TAKE ONE TABLET BY MOUTH ONE TIME DAILY  .  Melatonin 5 MG CAPS, Take 1 capsule by mouth at bedtime. .  Misc Natural Products (TART CHERRY ADVANCED PO), Take 3,000 mg by mouth daily. .  Multiple Vitamins-Minerals (PRESERVISION AREDS PO), Take 1 capsule by mouth 2 (two) times daily. Marland Kitchen  nystatin (MYCOSTATIN/NYSTOP) powder, Apply 1 application topically 3 (three) times daily. .  OXYGEN, Inhale 2 L into the lungs at bedtime. Marland Kitchen  QUEtiapine (SEROQUEL) 25 MG tablet, Take 1 tablet (25 mg total) by mouth at bedtime. Marland Kitchen  UNABLE TO FIND, Med Name: CBD Oil, 1/2 drop twice daily .  UNABLE TO FIND, Take 2 capsules by mouth daily. Med Name: Bertrum Sol   Reviewed prior external information including notes and imaging from  primary care provider As well as notes that were available from care everywhere and other healthcare systems.  Past medical history, social, surgical and family history all reviewed in electronic medical record.  No pertanent information unless stated regarding to the chief complaint.   Review of Systems:  No headache, visual changes, nausea, vomiting, diarrhea, constipation, dizziness, abdominal pain, skin rash, fevers, chills, night sweats, weight loss, swollen lymph nodes, body aches, joint swelling, chest pain, shortness of breath, mood changes. POSITIVE  muscle aches  Objective  Blood pressure 122/64, pulse (!) 57, height 5\' 4"  (1.626 m), weight 152 lb (68.9 kg), SpO2 98 %.   General: No apparent distress alert but not oriented HEENT: Pupils equal, extraocular movements intact  Respiratory: Patient's speak in full sentences and does not appear short of breath  Cardiovascular: No lower extremity edema, non tender, no erythema  Neuro: Cranial nerves II through XII are intact, neurovascularly intact in all extremities with 2+ DTRs and 2+ pulses.  Gait antalgic significant arthritic changes multiple joints Knee: Bilateral valgus deformity noted.  Abnormal thigh to calf ratio.  Tender to palpation over medial and PF joint line.  ROM full in flexion and extension and lower leg rotation. instability with valgus force.  painful patellar compression. Patellar glide with moderate crepitus. Patellar and quadriceps tendons unremarkable. Hamstring and quadriceps strength is normal.  After informed written and verbal consent, patient was seated on exam table. Right knee was prepped with alcohol swab and utilizing anterolateral approach, patient's right knee space was injected with 22 mg per  mL of Monovisc (sodium hyaluronate) in a  prefilled syringe was injected easily into the knee through a 22-gauge needle..Patient tolerated the procedure well without immediate complications.  Patient supplied on viscosupplementation  After informed written and verbal consent, patient was seated on exam table. Left knee was prepped with alcohol swab and utilizing anterolateral approach, patient's left knee space was injected with 22 mg/mL of Monovisc (sodium hyaluronate) in a prefilled syringe was injected easily into the knee through a 22-gauge needle..Patient tolerated the procedure well without immediate complications.  Patient supplied own viscosupplementation    Impression and Recommendations:     The above documentation has been reviewed and is accurate and  complete Lyndal Pulley, DO       Note: This dictation was prepared with Dragon dictation along with smaller phrase technology. Any transcriptional errors that result from this process are unintentional.

## 2019-06-23 NOTE — Assessment & Plan Note (Addendum)
Chronic problem with exacerbation.  Has responded well to Monovisc previously.  Bilateral steroid injection given last time.  Monovisc given today.  Patient supplied this medication.  Discussed with patient about the elevation work and repeating steroid injections.  Patient does have significant dementia and patient was accompanied with husband as well as daughter.

## 2019-06-28 ENCOUNTER — Encounter: Payer: Self-pay | Admitting: Nurse Practitioner

## 2019-06-28 ENCOUNTER — Other Ambulatory Visit: Payer: Self-pay

## 2019-06-28 ENCOUNTER — Ambulatory Visit (INDEPENDENT_AMBULATORY_CARE_PROVIDER_SITE_OTHER): Payer: Medicare HMO | Admitting: Nurse Practitioner

## 2019-06-28 VITALS — BP 152/80 | HR 58 | Temp 96.6°F | Ht 64.0 in | Wt 153.0 lb

## 2019-06-28 DIAGNOSIS — F0391 Unspecified dementia with behavioral disturbance: Secondary | ICD-10-CM

## 2019-06-28 DIAGNOSIS — F419 Anxiety disorder, unspecified: Secondary | ICD-10-CM | POA: Diagnosis not present

## 2019-06-28 DIAGNOSIS — G8929 Other chronic pain: Secondary | ICD-10-CM | POA: Diagnosis not present

## 2019-06-28 DIAGNOSIS — R69 Illness, unspecified: Secondary | ICD-10-CM | POA: Diagnosis not present

## 2019-06-28 DIAGNOSIS — M545 Low back pain, unspecified: Secondary | ICD-10-CM

## 2019-06-28 MED ORDER — DULOXETINE HCL 30 MG PO CPEP: 30.0000 mg | ORAL_CAPSULE | Freq: Every day | ORAL | 1 refills | Status: DC

## 2019-06-28 NOTE — Progress Notes (Signed)
Careteam: Patient Care Team: Sharon Seller, NP as PCP - General (Geriatric Medicine) Judi Saa, DO as Consulting Physician (Family Medicine) Eber Jones, MD as Referring Physician (Ophthalmology)  PLACE OF SERVICE:  [facility]  Advanced Directive information    No Known Allergies  Chief Complaint  Patient presents with  . Medication Management    Follow-up on seroquel. Patient often complains about belly, back, and ears. Here with Husband, daughter may join (currently in a work meeting)      HPI: Patient is a 84 y.o. female for follow up on seroquel.  Daughter reports she is getting more lost and not able to express what is going on with her which gives her a lot of anxiety. Feels like she had an anxiety attack last week.   Feels like seroquel is helping but not made a huge difference.  Helps with resting.  Recently been more agitated when laying down.  Taking ibuprofen more often- husband giving this to her, daughter educated him to give tylenol vs NSAID due to ongoing back issues She was injected in her knees last week.   Daughter states she had some issues with gas and got very agitated because she could not express what was going on  Review of Systems:  Review of Systems  Unable to perform ROS: Dementia   Past Medical History:  Diagnosis Date  . Allergy   . Arthritis   . Chicken pox   . COPD (chronic obstructive pulmonary disease) (HCC)   . Dementia (HCC)   . Dementia without behavioral disturbance (HCC)    unspecified type  . DJD (degenerative joint disease)   . Generalized headaches   . Hyperlipidemia   . Hypertension   . Hypertensive retinopathy of both eyes   . Kidney disease   . Low back pain   . Macular degeneration   . Major depressive disorder   . Neuropathy   . Nuclear sclerotic cataract of both eyes   . Osteoarthritis   . Peripheral neuropathy   . Retinal edema   . Thyroid disease   . UTI (urinary tract infection)   .  Vertigo   . Vitamin D deficiency    Past Surgical History:  Procedure Laterality Date  . ABDOMINAL HYSTERECTOMY    . COLONOSCOPY  2012  . SACRAL NERVE STIMULATOR PLACEMENT  2018  . TRIGGER FINGER RELEASE     Social History:   reports that she has never smoked. She has never used smokeless tobacco. She reports that she does not drink alcohol and does not use drugs.  Family History  Problem Relation Age of Onset  . Arthritis Mother   . Diabetes Mother   . Hearing loss Mother   . Hyperlipidemia Mother   . Hypertension Mother   . Aneurysm Mother   . Arthritis Father   . COPD Father   . Hearing loss Father   . Heart disease Father   . Hyperlipidemia Father   . Hypertension Father   . Kidney disease Father   . Anuerysm Father        aortic arch  . Dementia Father   . Arthritis Sister 15  . Hypertension Sister   . Macular degeneration Sister   . Aneurysm Sister        aortic  . Arthritis Sister   . Hearing loss Sister   . Hypertension Sister   . Aneurysm Sister        brain  . Early death  Brother 33  . Hyperlipidemia Brother   . Hypertension Brother   . Aneurysm Brother        aortic  . Arthritis Brother   . Hypertension Brother   . Gout Brother   . Macular degeneration Brother   . Aneurysm Brother        aortic  . Other Brother 93       [employer] [place]  . Hyperlipidemia Daughter 76  . Hypertension Daughter   . Depression Daughter   . Arthritis Daughter   . Arthritis Daughter   . Hypertension Son     Medications: Patient's Medications  New Prescriptions   No medications on file  Previous Medications   ALBUTEROL (PROVENTIL HFA;VENTOLIN HFA) 108 (90 BASE) MCG/ACT INHALER    Inhale 1-2 puffs into the lungs every 6 (six) hours as needed for wheezing or shortness of breath.   CHOLECALCIFEROL 125 MCG (5000 UT) CAPSULE    Take 5,000 Units by mouth daily.   ESCITALOPRAM (LEXAPRO) 10 MG TABLET    TAKE ONE TABLET BY MOUTH ONE TIME DAILY    LOSARTAN-HYDROCHLOROTHIAZIDE  (HYZAAR) 100-12.5 MG TABLET    TAKE 1 TABLET BY MOUTH EVERY DAY   MELATONIN 5 MG CAPS    Take 1 capsule by mouth at bedtime.   MISC NATURAL PRODUCTS (TART CHERRY ADVANCED PO)    Take 3,000 mg by mouth daily.   MULTIPLE VITAMINS-MINERALS (PRESERVISION AREDS PO)    Take 1 capsule by mouth 2 (two) times daily.   NYSTATIN (MYCOSTATIN/NYSTOP) POWDER    Apply 1 application topically 3 (three) times daily.   OXYGEN    Inhale 2 L into the lungs at bedtime.   QUETIAPINE (SEROQUEL) 25 MG TABLET    Take 1 tablet (25 mg total) by mouth at bedtime.   UNABLE TO FIND    Med Name: CBD Oil, 1/2 drop twice daily   UNABLE TO FIND    Take 2 capsules by mouth daily. Med Name: NeruoQ  Modified Medications   No medications on file  Discontinued Medications   No medications on file    Physical Exam:  Vitals:   06/28/19 1456  BP: (!) 152/80  Pulse: (!) 58  Temp: (!) 96.6 F (35.9 C)  TempSrc: Temporal  SpO2: 99%  Weight: 153 lb (69.4 kg)  Height: 5\' 4"  (1.626 m)   Body mass index is 26.26 kg/m. Wt Readings from Last 3 Encounters:  06/28/19 153 lb (69.4 kg)  06/23/19 152 lb (68.9 kg)  03/29/19 153 lb 3.2 oz (69.5 kg)    Physical Exam HENT:     Head: Normocephalic and atraumatic.  Cardiovascular:     Rate and Rhythm: Normal rate and regular rhythm.  Pulmonary:     Effort: Pulmonary effort is normal.     Breath sounds: Normal breath sounds.  Abdominal:     General: Bowel sounds are normal.     Palpations: Abdomen is soft.  Skin:    General: Skin is warm and dry.  Neurological:     Mental Status: She is alert. Mental status is at baseline.  Psychiatric:        Cognition and Memory: Cognition is impaired. Memory is impaired. She exhibits impaired recent memory and impaired remote memory.     Labs reviewed: Basic Metabolic Panel: Recent Labs    09/30/18 1610 03/29/19 1436  NA 140 142  K 3.9 4.3  CL 104 106  CO2 29 30  GLUCOSE 102 92  BUN 17 21  CREATININE 1.02* 1.24*  CALCIUM  9.6 9.6  TSH  --  2.25   Liver Function Tests: Recent Labs    09/30/18 1610 03/29/19 1436  AST 16 15  ALT 11 9  BILITOT 0.5 0.6  PROT 7.0 6.8   No results for input(s): LIPASE, AMYLASE in the last 8760 hours. No results for input(s): AMMONIA in the last 8760 hours. CBC: Recent Labs    09/30/18 1610 03/29/19 1436  WBC 6.7 6.5  NEUTROABS 4,449 4,713  HGB 12.0 11.7  HCT 37.0 36.1  MCV 89.4 89.1  PLT 248 262   Lipid Panel: No results for input(s): CHOL, HDL, LDLCALC, TRIG, CHOLHDL, LDLDIRECT in the last 8760 hours. TSH: Recent Labs    03/29/19 1436  TSH 2.25   A1C: No results found for: HGBA1C   Assessment/Plan 1. Dementia with behavioral disturbance, unspecified dementia type (HCC) Progressive decline in memory, unable to express herself and with increase in anxiety and agitation.  Seroquel has been beneficial with agitation in the evenings but continues to persist.  -can take additional tablet if needed for severe agitation.  2. Anxiety -will change anxiety medication to see if this helps with mood and pain.to stop lexapro and start cymbalta 30 mg daily - DULoxetine (CYMBALTA) 30 MG capsule; Take 1 capsule (30 mg total) by mouth daily.  Dispense: 30 capsule; Refill: 1  3. Chronic bilateral low back pain, unspecified whether sciatica present -ongoing, can use tylenol 500 mg 2 tablets scheduled at bedtime to help with pain. Will also use Cymbalta to see if this improves pain. - DULoxetine (CYMBALTA) 30 MG capsule; Take 1 capsule (30 mg total) by mouth daily.  Dispense: 30 capsule; Refill: 1 There are no diagnoses linked to this encounter.  Next appt: 6 weeks on pain, anxiety and agitation. Janene Harvey. Biagio Borg  [facility] [phone]

## 2019-06-28 NOTE — Patient Instructions (Addendum)
Stop lexapro  Start cymbalta 30 mg daily    Can take another dose of seroquel if needed for extreme agitation to notify if this becomes ongoing.

## 2019-07-05 DIAGNOSIS — R69 Illness, unspecified: Secondary | ICD-10-CM | POA: Diagnosis not present

## 2019-07-11 ENCOUNTER — Other Ambulatory Visit: Payer: Self-pay | Admitting: Nurse Practitioner

## 2019-07-11 NOTE — Telephone Encounter (Signed)
Patient is requesting refill on medication "Quetiapine 25mg ". Patient last refill was 05/18/2019 with 30 tablets and 1 additional refill. I'm not sure if this medication is long term. Medication pend and sent to provider PCP 07/18/2019, NP . Please Advise.

## 2019-07-12 ENCOUNTER — Encounter: Payer: Self-pay | Admitting: Nurse Practitioner

## 2019-07-13 MED ORDER — QUETIAPINE FUMARATE 25 MG PO TABS: 25.0000 mg | ORAL_TABLET | Freq: Every day | ORAL | 1 refills | Status: DC

## 2019-07-13 NOTE — Telephone Encounter (Signed)
RX sent to pharmacy for 90 day supply.  

## 2019-07-13 NOTE — Telephone Encounter (Signed)
Message routed to Sharon Seller, NP to confirm if ok to refill medications as is or if any changes need to be made prior to 90 day supply

## 2019-08-07 ENCOUNTER — Encounter: Payer: Self-pay | Admitting: Nurse Practitioner

## 2019-08-07 ENCOUNTER — Ambulatory Visit (INDEPENDENT_AMBULATORY_CARE_PROVIDER_SITE_OTHER): Payer: Medicare HMO | Admitting: Nurse Practitioner

## 2019-08-07 ENCOUNTER — Other Ambulatory Visit: Payer: Self-pay

## 2019-08-07 VITALS — BP 118/70 | HR 60 | Temp 96.2°F | Ht 64.0 in | Wt 156.0 lb

## 2019-08-07 DIAGNOSIS — M545 Low back pain: Secondary | ICD-10-CM

## 2019-08-07 DIAGNOSIS — F419 Anxiety disorder, unspecified: Secondary | ICD-10-CM

## 2019-08-07 DIAGNOSIS — G8929 Other chronic pain: Secondary | ICD-10-CM | POA: Diagnosis not present

## 2019-08-07 DIAGNOSIS — F0391 Unspecified dementia with behavioral disturbance: Secondary | ICD-10-CM

## 2019-08-07 DIAGNOSIS — R69 Illness, unspecified: Secondary | ICD-10-CM | POA: Diagnosis not present

## 2019-08-07 MED ORDER — DULOXETINE HCL 60 MG PO CPEP: 60.0000 mg | ORAL_CAPSULE | Freq: Every day | ORAL | 1 refills | Status: DC

## 2019-08-07 NOTE — Progress Notes (Signed)
Careteam: Patient Care Team: Sharon Seller, NP as PCP - General (Geriatric Medicine) Judi Saa, DO as Consulting Physician (Family Medicine) Eber Jones, MD as Referring Physician (Ophthalmology)  PLACE OF SERVICE:  [facility]  Advanced Directive information    No Known Allergies  Chief Complaint  Patient presents with  . Follow-up    6 week follow-up on Cymbalta. Here with [name]   . Medication Management    FYI when the oxygen falls off at night patient keeps off, ? if it needs to remain on her medication list   . FYI    Bilateral knee injections were administered on 06/23/2019, patient feels much better x 1 week after getting injections.      HPI: Patient is a 84 y.o. female for follow up on cymbalta.  Doing well on Cymbalta. No side effects noted. Some days has really good mood. Does not know if there is any effect with pain from medication. Resting well at night consistently. A few bad evening. Had to take an extra Seroquel.  Seroquel beneficial. Some anxiety and agitation in the evening but less.   Got knee injections and was doing so much better- more active, better mood.   Went on a 5 hour car trip and went to high school reunion. Went from Thursday-Sunday. She did well with this.  Review of Systems:  Review of Systems  Constitutional: Negative for chills, fever and malaise/fatigue.  Psychiatric/Behavioral: Positive for memory loss. Negative for depression and hallucinations. The patient is nervous/anxious (improved). The patient does not have insomnia.     Past Medical History:  Diagnosis Date  . Allergy   . Arthritis   . Chicken pox   . COPD (chronic obstructive pulmonary disease) (HCC)   . Dementia (HCC)   . Dementia without behavioral disturbance (HCC)    unspecified type  . DJD (degenerative joint disease)   . Generalized headaches   . Hyperlipidemia   . Hypertension   . Hypertensive retinopathy of both eyes   . Kidney disease   . Low  back pain   . Macular degeneration   . Major depressive disorder   . Neuropathy   . Nuclear sclerotic cataract of both eyes   . Osteoarthritis   . Peripheral neuropathy   . Retinal edema   . Thyroid disease   . UTI (urinary tract infection)   . Vertigo   . Vitamin D deficiency    Past Surgical History:  Procedure Laterality Date  . ABDOMINAL HYSTERECTOMY    . COLONOSCOPY  2012  . SACRAL NERVE STIMULATOR PLACEMENT  2018  . TRIGGER FINGER RELEASE     Social History:   reports that she has never smoked. She has never used smokeless tobacco. She reports that she does not drink alcohol and does not use drugs.  Family History  Problem Relation Age of Onset  . Arthritis Mother   . Diabetes Mother   . Hearing loss Mother   . Hyperlipidemia Mother   . Hypertension Mother   . Aneurysm Mother   . Arthritis Father   . COPD Father   . Hearing loss Father   . Heart disease Father   . Hyperlipidemia Father   . Hypertension Father   . Kidney disease Father   . Anuerysm Father        aortic arch  . Dementia Father   . Arthritis Sister 4  . Hypertension Sister   . Macular degeneration Sister   .  Aneurysm Sister        aortic  . Arthritis Sister   . Hearing loss Sister   . Hypertension Sister   . Aneurysm Sister        brain  . Early death Brother 55  . Hyperlipidemia Brother   . Hypertension Brother   . Aneurysm Brother        aortic  . Arthritis Brother   . Hypertension Brother   . Gout Brother   . Macular degeneration Brother   . Aneurysm Brother        aortic  . Other Brother 53       [place]  . Hyperlipidemia Daughter 74  . Hypertension Daughter   . Depression Daughter   . Arthritis Daughter   . Arthritis Daughter   . Hypertension Son     Medications: Patient's Medications  New Prescriptions   No medications on file  Previous Medications   ALBUTEROL (PROVENTIL HFA;VENTOLIN HFA) 108 (90 BASE) MCG/ACT INHALER    Inhale 1-2 puffs into the lungs every 6  (six) hours as needed for wheezing or shortness of breath.   CHOLECALCIFEROL 125 MCG (5000 UT) CAPSULE    Take 5,000 Units by mouth daily.   DULOXETINE (CYMBALTA) 30 MG CAPSULE    Take 1 capsule (30 mg total) by mouth daily.   LOSARTAN-HYDROCHLOROTHIAZIDE (HYZAAR) 100-12.5 MG TABLET    TAKE 1 TABLET BY MOUTH EVERY DAY   MELATONIN 5 MG CAPS    Take 1 capsule by mouth at bedtime.   MISC NATURAL PRODUCTS (TART CHERRY ADVANCED PO)    Take 3,000 mg by mouth daily.   MULTIPLE VITAMINS-MINERALS (PRESERVISION AREDS PO)    Take 1 capsule by mouth 2 (two) times daily.   NYSTATIN (MYCOSTATIN/NYSTOP) POWDER    Apply 1 application topically 3 (three) times daily.   OXYGEN    Inhale 2 L into the lungs at bedtime.   QUETIAPINE (SEROQUEL) 25 MG TABLET    Take 1 tablet (25 mg total) by mouth at bedtime.   UNABLE TO FIND    Med Name: CBD Oil, 1/2 drop twice daily   UNABLE TO FIND    Take 2 capsules by mouth daily. Med Name: NeruoQ  Modified Medications   No medications on file  Discontinued Medications   No medications on file    Physical Exam:  Vitals:   08/07/19 1549  BP: 118/70  Pulse: 60  Temp: (!) 96.2 F (35.7 C)  TempSrc: Temporal  SpO2: 97%  Weight: 156 lb (70.8 kg)  Height: 5\' 4"  (1.626 m)   Body mass index is 26.78 kg/m. Wt Readings from Last 3 Encounters:  08/07/19 156 lb (70.8 kg)  06/28/19 153 lb (69.4 kg)  06/23/19 152 lb (68.9 kg)    Physical Exam Constitutional:      Appearance: Normal appearance.  HENT:     Head: Normocephalic and atraumatic.  Skin:    General: Skin is warm and dry.  Neurological:     Mental Status: She is alert.  Psychiatric:        Mood and Affect: Affect is flat.        Cognition and Memory: Cognition is impaired. Memory is impaired. She exhibits impaired recent memory.     Labs reviewed: Basic Metabolic Panel: Recent Labs    09/30/18 1610 03/29/19 1436  NA 140 142  K 3.9 4.3  CL 104 106  CO2 29 30  GLUCOSE 102 92  BUN 17 21  CREATININE 1.02* 1.24*  CALCIUM 9.6 9.6  TSH  --  2.25   Liver Function Tests: Recent Labs    09/30/18 1610 03/29/19 1436  AST 16 15  ALT 11 9  BILITOT 0.5 0.6  PROT 7.0 6.8   No results for input(s): LIPASE, AMYLASE in the last 8760 hours. No results for input(s): AMMONIA in the last 8760 hours. CBC: Recent Labs    09/30/18 1610 03/29/19 1436  WBC 6.7 6.5  NEUTROABS 4,449 4,713  HGB 12.0 11.7  HCT 37.0 36.1  MCV 89.4 89.1  PLT 248 262   Lipid Panel: No results for input(s): CHOL, HDL, LDLCALC, TRIG, CHOLHDL, LDLDIRECT in the last 8760 hours. TSH: Recent Labs    03/29/19 1436  TSH 2.25   A1C: No results found for: HGBA1C   Assessment/Plan 1. Anxiety -improved at this time.  - DULoxetine (CYMBALTA) 60 MG capsule; Take 1 capsule (60 mg total) by mouth daily.  Dispense: 30 capsule; Refill: 1  2. Chronic bilateral low back pain, unspecified whether sciatica present -unsure if any benefit with cymbalta, will increase to 60 mg to see if this provides better pain/anxiety relief.  - DULoxetine (CYMBALTA) 60 MG capsule; Take 1 capsule (60 mg total) by mouth daily.  Dispense: 30 capsule; Refill: 1  3. Dementia with behavioral disturbance, unspecified dementia type (HCC) Slow decline noted per daughter. Very supportive husband and family that helps her with her ADLs. Continues on Seroquel due to increase in agitation in the evenings which has been very beneficial.   Next appt: 09/29/2019 as scheduled.  Janene Harvey. Biagio Borg  [facility] [phone]

## 2019-08-09 ENCOUNTER — Ambulatory Visit: Payer: Medicare HMO | Admitting: Nurse Practitioner

## 2019-08-31 DIAGNOSIS — F0281 Dementia in other diseases classified elsewhere with behavioral disturbance: Secondary | ICD-10-CM | POA: Diagnosis not present

## 2019-08-31 DIAGNOSIS — R69 Illness, unspecified: Secondary | ICD-10-CM | POA: Diagnosis not present

## 2019-08-31 DIAGNOSIS — J168 Pneumonia due to other specified infectious organisms: Secondary | ICD-10-CM | POA: Diagnosis not present

## 2019-08-31 DIAGNOSIS — E785 Hyperlipidemia, unspecified: Secondary | ICD-10-CM | POA: Diagnosis not present

## 2019-08-31 DIAGNOSIS — R569 Unspecified convulsions: Secondary | ICD-10-CM | POA: Diagnosis not present

## 2019-08-31 DIAGNOSIS — Z20822 Contact with and (suspected) exposure to covid-19: Secondary | ICD-10-CM | POA: Diagnosis not present

## 2019-08-31 DIAGNOSIS — H35033 Hypertensive retinopathy, bilateral: Secondary | ICD-10-CM | POA: Diagnosis not present

## 2019-08-31 DIAGNOSIS — Z79899 Other long term (current) drug therapy: Secondary | ICD-10-CM | POA: Diagnosis not present

## 2019-08-31 DIAGNOSIS — R918 Other nonspecific abnormal finding of lung field: Secondary | ICD-10-CM | POA: Diagnosis not present

## 2019-08-31 DIAGNOSIS — R4182 Altered mental status, unspecified: Secondary | ICD-10-CM | POA: Diagnosis not present

## 2019-08-31 DIAGNOSIS — G9341 Metabolic encephalopathy: Secondary | ICD-10-CM | POA: Diagnosis not present

## 2019-08-31 DIAGNOSIS — G9340 Encephalopathy, unspecified: Secondary | ICD-10-CM | POA: Diagnosis not present

## 2019-08-31 DIAGNOSIS — G301 Alzheimer's disease with late onset: Secondary | ICD-10-CM | POA: Diagnosis not present

## 2019-08-31 DIAGNOSIS — I63233 Cerebral infarction due to unspecified occlusion or stenosis of bilateral carotid arteries: Secondary | ICD-10-CM | POA: Diagnosis not present

## 2019-08-31 DIAGNOSIS — R4781 Slurred speech: Secondary | ICD-10-CM | POA: Diagnosis not present

## 2019-08-31 DIAGNOSIS — R269 Unspecified abnormalities of gait and mobility: Secondary | ICD-10-CM | POA: Diagnosis not present

## 2019-08-31 DIAGNOSIS — R531 Weakness: Secondary | ICD-10-CM | POA: Diagnosis not present

## 2019-08-31 DIAGNOSIS — H538 Other visual disturbances: Secondary | ICD-10-CM | POA: Diagnosis not present

## 2019-09-01 DIAGNOSIS — G9341 Metabolic encephalopathy: Secondary | ICD-10-CM | POA: Diagnosis not present

## 2019-09-01 DIAGNOSIS — H35033 Hypertensive retinopathy, bilateral: Secondary | ICD-10-CM | POA: Diagnosis not present

## 2019-09-01 DIAGNOSIS — H538 Other visual disturbances: Secondary | ICD-10-CM | POA: Diagnosis not present

## 2019-09-01 DIAGNOSIS — I081 Rheumatic disorders of both mitral and tricuspid valves: Secondary | ICD-10-CM | POA: Diagnosis not present

## 2019-09-01 DIAGNOSIS — R4781 Slurred speech: Secondary | ICD-10-CM | POA: Diagnosis not present

## 2019-09-02 DIAGNOSIS — H538 Other visual disturbances: Secondary | ICD-10-CM | POA: Diagnosis not present

## 2019-09-02 DIAGNOSIS — R4781 Slurred speech: Secondary | ICD-10-CM | POA: Diagnosis not present

## 2019-09-02 DIAGNOSIS — H35033 Hypertensive retinopathy, bilateral: Secondary | ICD-10-CM | POA: Diagnosis not present

## 2019-09-02 DIAGNOSIS — G9341 Metabolic encephalopathy: Secondary | ICD-10-CM | POA: Diagnosis not present

## 2019-09-07 ENCOUNTER — Encounter: Payer: Self-pay | Admitting: Internal Medicine

## 2019-09-07 ENCOUNTER — Ambulatory Visit
Admission: RE | Admit: 2019-09-07 | Discharge: 2019-09-07 | Disposition: A | Discharge status: Home / Self Care | Payer: Medicare HMO | Source: Ambulatory Visit | Attending: Internal Medicine | Admitting: Internal Medicine

## 2019-09-07 ENCOUNTER — Ambulatory Visit (INDEPENDENT_AMBULATORY_CARE_PROVIDER_SITE_OTHER): Payer: Medicare HMO | Admitting: Internal Medicine

## 2019-09-07 ENCOUNTER — Other Ambulatory Visit: Payer: Self-pay

## 2019-09-07 VITALS — BP 122/72 | HR 72 | Temp 97.1°F | Ht 64.0 in | Wt 151.0 lb

## 2019-09-07 DIAGNOSIS — R410 Disorientation, unspecified: Secondary | ICD-10-CM | POA: Diagnosis not present

## 2019-09-07 DIAGNOSIS — G459 Transient cerebral ischemic attack, unspecified: Secondary | ICD-10-CM

## 2019-09-07 DIAGNOSIS — F0391 Unspecified dementia with behavioral disturbance: Secondary | ICD-10-CM | POA: Diagnosis not present

## 2019-09-07 DIAGNOSIS — R05 Cough: Secondary | ICD-10-CM | POA: Diagnosis not present

## 2019-09-07 DIAGNOSIS — R69 Illness, unspecified: Secondary | ICD-10-CM | POA: Diagnosis not present

## 2019-09-07 DIAGNOSIS — R918 Other nonspecific abnormal finding of lung field: Secondary | ICD-10-CM

## 2019-09-07 MED ORDER — ASPIRIN EC 81 MG PO TBEC: 81.0000 mg | DELAYED_RELEASE_TABLET | Freq: Every day | ORAL | 11 refills | Status: AC

## 2019-09-07 NOTE — Progress Notes (Signed)
Location:  [facility] clinic Provider: Jazlynn Nemetz L. Renato Gails, D.O., C.M.D.  Goals of Care:  Advanced Directives 09/07/2019  Does Patient Have a Medical Advance Directive? Yes  Type of Advance Directive Healthcare Power of Attorney  Does patient want to make changes to medical advance directive? No - Patient declined  Copy of Healthcare Power of Attorney in Chart? -   Chief Complaint  Patient presents with  . Transitions [facility] Follow Up   . Health Maintenance    High dose influenza out of stock     HPI: Patient is a 84 y.o. female patient of NP Janyth Contes seen today due to ED visit at [facility] 08/31/19 with altered mental status and slurred speech.  It appears she was not admitted.  She has a h/o htn, dementia, low back pain, bilateral knee OA, hyperlipidemia, chronic pain and anxiety. She is here with her daughter, Chelsea Maddox.  She does have advanced alzheimer's.  Shanda Bumps made some med changes in June--put on seroquel 25mg  at night--had delusions tha [place] and that was interferes with sleep.  She's had a lot of joint pain and arthritis.  Put on cymbalta in place of lexapro.  8/1, cymbalta started.    Thurs afternoon, she would not get out of bed and husband called .  Will get out of bed and walk around, likes to listen to music and talk, but conversations not fluent or appropriate.  When [place] arrived, her eyes were wide open, she did not respond to her.  She shook her, moved her, and she did not.  [place] called 911 and they took her to [facility].  After 2 hrs she woke up over there and seemed more normal.  She had a little left sided facial droop.  She'd been flaccid, but stiffened up for EMS.  They had a thorough workup.  Had abnormal CXR and got one dose abx--zithromax once.  Doctor in ED said if she got fever or cough, to bring her to [facility], but otherwise f/u in a month so understand why.  She has headaches occasionally and has numbness in hands from neck.  There  was a CT head.  No acute stroke.  Has a nerve stimulator so MRI cannot be done.    At home, [place] tried to get her to bring her to her house but she could not make it farther than the bathroom.  Yesterday, her granddaughter had to help her to the bathroom.  Today, she slid down the edge of the bed and had to be picked up.  She has a walker, but cannot use it b/c she gets stuck on stuff.    She does not like her husband dressing her.  Her brother lives with them now.  The plan will be for them to live on the same property with Chelsea Maddox.    Moans and groans when moved.    Statin was discontinued and calcium supplement.  Baby asa was started.    Appetite has slowed, but it's usually great.   Past Medical History:  Diagnosis Date  . Allergy   . Arthritis   . Chicken pox   . COPD (chronic obstructive pulmonary disease) (HCC)   . Dementia (HCC)   . Dementia without behavioral disturbance (HCC)    unspecified type  . DJD (degenerative joint disease)   . Generalized headaches   . Hyperlipidemia   . Hypertension   . Hypertensive retinopathy of both eyes   .  Kidney disease   . Low back pain   . Macular degeneration   . Major depressive disorder   . Neuropathy   . Nuclear sclerotic cataract of both eyes   . Osteoarthritis   . Peripheral neuropathy   . Retinal edema   . Thyroid disease   . UTI (urinary tract infection)   . Vertigo   . Vitamin D deficiency     Past Surgical History:  Procedure Laterality Date  . ABDOMINAL HYSTERECTOMY    . COLONOSCOPY  2012  . SACRAL NERVE STIMULATOR PLACEMENT  2018  . TRIGGER FINGER RELEASE      No Known Allergies  Outpatient Encounter Medications as of 09/07/2019  Medication Sig  . albuterol (PROVENTIL HFA;VENTOLIN HFA) 108 (90 Base) MCG/ACT inhaler Inhale 1-2 puffs into the lungs every 6 (six) hours as needed for wheezing or shortness of breath.  . Cholecalciferol 125 MCG (5000 UT) capsule Take 5,000 Units by mouth daily.  . DULoxetine  (CYMBALTA) 60 MG capsule Take 1 capsule (60 mg total) by mouth daily.  Marland Kitchen. losartan-hydrochlorothiazide (HYZAAR) 100-12.5 MG tablet TAKE 1 TABLET BY MOUTH EVERY DAY  . Melatonin 5 MG CAPS Take 1 capsule by mouth at bedtime.  . Misc Natural Products (TART CHERRY ADVANCED PO) Take 3,000 mg by mouth daily.  . Multiple Vitamins-Minerals (PRESERVISION AREDS PO) Take 1 capsule by mouth 2 (two) times daily.  Marland Kitchen. nystatin (MYCOSTATIN/NYSTOP) powder Apply 1 application topically 3 (three) times daily.  . QUEtiapine (SEROQUEL) 25 MG tablet Take 1 tablet (25 mg total) by mouth at bedtime.  Marland Kitchen. UNABLE TO FIND Med Name: CBD Oil, 1/2 drop twice daily  . UNABLE TO FIND Take 2 capsules by mouth daily. Med Name: Bertrum SoleruoQ  . [DISCONTINUED] OXYGEN Inhale 2 L into the lungs at bedtime. (Patient not taking: Reported on 08/07/2019)   No facility-administered encounter medications on file as of 09/07/2019.    Review of Systems:  Review of Systems  Constitutional: Positive for malaise/fatigue. Negative for chills and fever.  HENT: Negative for congestion and sore throat.   Eyes: Negative for blurred vision.       Macular degeneration  Respiratory: Negative for cough and shortness of breath.   Cardiovascular: Negative for chest pain, palpitations and leg swelling.  Gastrointestinal: Negative for abdominal pain, blood in stool, constipation and melena.  Genitourinary: Negative for dysuria.  Musculoskeletal: Positive for back pain, joint pain, myalgias and neck pain. Negative for falls.  Skin: Negative for itching and rash.  Neurological: Positive for tingling and sensory change. Negative for dizziness and loss of consciousness.  Endo/Heme/Allergies: Bruises/bleeds easily.  Psychiatric/Behavioral: Positive for memory loss. Negative for depression.    Health Maintenance  Topic Date Due  . INFLUENZA VACCINE  08/13/2019  . PNA vac Low Risk Adult (2 of 2 - PCV13) 01/02/2020  . TETANUS/TDAP  06/12/2024  . DEXA SCAN   Completed  . COVID-19 Vaccine  Completed    Physical Exam: Vitals:   09/07/19 1255  BP: 122/72  Pulse: 72  Temp: (!) 97.1 F (36.2 C)  SpO2: 98%  Weight: 151 lb (68.5 kg)  Height: 5\' 4"  (1.626 m)   Body mass index is 25.92 kg/m. Physical Exam Vitals reviewed.  Constitutional:      Appearance: Normal appearance.  Cardiovascular:     Rate and Rhythm: Normal rate and regular rhythm.  Pulmonary:     Effort: Pulmonary effort is normal.     Breath sounds: Normal breath sounds. No wheezing, rhonchi or rales.  Abdominal:     General: Bowel sounds are normal.     Palpations: Abdomen is soft.     Tenderness: There is no abdominal tenderness.  Musculoskeletal:        General: Normal range of motion.     Right lower leg: No edema.     Left lower leg: No edema.     Comments: Comes into exam room in wheelchair  Skin:    General: Skin is warm and dry.  Neurological:     General: No focal deficit present.     Mental Status: She is alert.  Psychiatric:        Mood and Affect: Mood normal.        Behavior: Behavior normal.     Labs reviewed: Basic Metabolic Panel: Recent Labs    09/30/18 1610 03/29/19 1436  NA 140 142  K 3.9 4.3  CL 104 106  CO2 29 30  GLUCOSE 102 92  BUN 17 21  CREATININE 1.02* 1.24*  CALCIUM 9.6 9.6  TSH  --  2.25   Liver Function Tests: Recent Labs    09/30/18 1610 03/29/19 1436  AST 16 15  ALT 11 9  BILITOT 0.5 0.6  PROT 7.0 6.8   No results for input(s): LIPASE, AMYLASE in the last 8760 hours. No results for input(s): AMMONIA in the last 8760 hours. CBC: Recent Labs    09/30/18 1610 03/29/19 1436  WBC 6.7 6.5  NEUTROABS 4,449 4,713  HGB 12.0 11.7  HCT 37.0 36.1  MCV 89.4 89.1  PLT 248 262   Lipid Panel: No results for input(s): CHOL, HDL, LDLCALC, TRIG, CHOLHDL, LDLDIRECT in the last 8760 hours. No results found for: HGBA1C  Procedures since last visit: DG Bone Density  Result Date: 02/21/2018 EXAM: DUAL X-RAY  ABSORPTIOMETRY (DXA) FOR BONE MINERAL DENSITY IMPRESSION: Referring Physician:  Sharon Seller Your patient completed a BMD test using Lunar IDXA DXA system ( analysis version: 16 ) manufactured by [employer]. Technologist: AW PATIENT: Name: Chelsea Maddox, Chelsea Maddox Patient ID: [id number] Birth Date: [birth year] Height: 62.0 in. Sex: Female Measured: 02/21/2018 Weight: 150.0 lbs. Indications: Advanced Age, Bilateral Ovariectomy (65.51), Caucasian, Dementia (290.0), Estrogen Deficient, Hysterectomy, Lexapro, Pantoprazole, Postmenopausal Fractures: Elbow Treatments: Vitamin D (E933.5) ASSESSMENT: The BMD measured at Forearm Radius 33% is 0.628 g/cm2 with a T-score of -2.9. This patient is considered osteoporotic according to World Health Organization [facility]) criteria. The scan quality is good. Lumbar spine was not utilized due to advanced degenerative changes. Site Region Measured Date Measured Age YA BMD Significant CHANGE T-score Left Forearm Radius 33% 02/21/2018 80.4 -2.9 0.628 g/cm2 DualFemur Total Right 02/21/2018 80.4 -2.0 0.761 g/cm2 DualFemur Total Mean 02/21/2018 80.4 -1.8 0.787 g/cm2 World Health Organization [facility]) criteria for post-menopausal, Caucasian Women: Normal       T-score at or above -1 SD Osteopenia   T-score between -1 and -2.5 SD Osteoporosis T-score at or below -2.5 SD RECOMMENDATION: 1. All patients should optimize calcium and vitamin D intake. 2. Consider FDA approved medical therapies in postmenopausal women and men aged 11 years and older, based on the following: a. A hip or vertebral (clinical or morphometric) fracture b. T- score < or = -2.5 at the femoral neck or spine after appropriate evaluation to exclude secondary causes c. Low bone mass (T-score between -1.0 and -2.5 at the femoral neck or spine) and a 10 year probability of a hip fracture > or = 3% or a 10 year probability of a major osteoporosis-related fracture >  or = 20% based on the [place]-adapted WHO algorithm d. Clinician judgment and/or  patient preferences may indicate treatment for people with 10-year fracture probabilities above or below these levels FOLLOW-UP: People with diagnosed cases of osteoporosis or at high risk for fracture should have regular bone mineral density tests. For patients eligible for Medicare, routine testing is allowed once every 2 years. The testing frequency can be increased to one year for patients who have rapidly progressing disease, those who are receiving or discontinuing medical therapy to restore bone mass, or have additional risk factors. I have reviewed this report and agree with the above findings. [facility] Radiology Electronically Signed   By: Lauralyn Primes M.D.   On: 02/21/2018 12:14   MM DIGITAL SCREENING BILATERAL  Result Date: 02/22/2018 CLINICAL DATA:  Screening. EXAM: DIGITAL SCREENING BILATERAL MAMMOGRAM WITH CAD COMPARISON:  Previous exam(s). ACR Breast Density Category b: There are scattered areas of fibroglandular density. FINDINGS: There are no findings suspicious for malignancy. Images were processed with CAD. IMPRESSION: No mammographic evidence of malignancy. A result letter of this screening mammogram will be mailed directly to the patient. RECOMMENDATION: Screening mammogram in one year. (Code:[id number]-B-01Y) BI-RADS CATEGORY  1: Negative. Electronically Signed   By: Gerome Sam III M.D   On: 02/22/2018 09:04   Assessment/Plan 1. TIA (transient ischemic attack) - vs delirium - not a stroke--no acute findings on imaging, could not have MRI due to pacer - had abnormal PORTABLE CXR at [facility] ED - will recheck xray  - is now on asa for prevention of stroke - Ambulatory referral to Home Health due to persistent weakness (is gradually improving, nothing focal)  2. Delirium -as in #1, not clear if she was delirious from pneumonia vs TIA but did have dysarthria, aphasia and possible facial droop more c/w TIA - Ambulatory referral to Home Health  3. Left lower lobe pulmonary  infiltrate - on portable film, had a dose of abx in ED - DG Chest 2 View today - Ambulatory referral to Home Health  4. Dementia with behavioral disturbance, unspecified dementia type (HCC) - gradually progressive, but much weaker since event that took her to ED, also moving into new environment so OT home safety would be helpful to set it up appropriately and safely, needs PT for strengthening - Ambulatory referral to Home Health  Labs/tests ordered:   Orders Placed This Encounter  Procedures  . DG Chest 2 View    Order Specific Question:   Reason for Exam (SYMPTOM  OR DIAGNOSIS REQUIRED)    Answer:   left lower lobe abnormality    Order Specific Question:   Preferred imaging location?    Answer:   [id number]    Order Specific Question:   Release to patient    Answer:   Immediate  . Ambulatory referral to Home Health    Referral Priority:   Routine    Referral Type:   Home Health Care    Referral Reason:   Specialty Services Required    Requested Specialty:   Home Health Services    Number of Visits Requested:   1    Next appt:  09/29/2019  Sylvanna Burggraf L. Vidalia Serpas, D.O. Geriatrics [facility] [address], [place] [address] Cell Phone (Mon-Fri 8am-5pm):  [phone] On Call:  [phone] & follow prompts after 5pm & weekends Office Phone:  [phone] Office Fax:  [phone]

## 2019-09-08 NOTE — Progress Notes (Signed)
Outside of low lung volumes, her lungs were normal on exam so suspect her event was a TIA and it's appropriate to continue with the baby aspirin that was newly added for that.

## 2019-09-17 DIAGNOSIS — I10 Essential (primary) hypertension: Secondary | ICD-10-CM | POA: Diagnosis not present

## 2019-09-17 DIAGNOSIS — J449 Chronic obstructive pulmonary disease, unspecified: Secondary | ICD-10-CM | POA: Diagnosis not present

## 2019-09-17 DIAGNOSIS — M170 Bilateral primary osteoarthritis of knee: Secondary | ICD-10-CM | POA: Diagnosis not present

## 2019-09-17 DIAGNOSIS — R69 Illness, unspecified: Secondary | ICD-10-CM | POA: Diagnosis not present

## 2019-09-17 DIAGNOSIS — R911 Solitary pulmonary nodule: Secondary | ICD-10-CM | POA: Diagnosis not present

## 2019-09-17 DIAGNOSIS — M545 Low back pain, unspecified: Secondary | ICD-10-CM | POA: Diagnosis not present

## 2019-09-17 DIAGNOSIS — G629 Polyneuropathy, unspecified: Secondary | ICD-10-CM | POA: Diagnosis not present

## 2019-09-17 DIAGNOSIS — G894 Chronic pain syndrome: Secondary | ICD-10-CM | POA: Diagnosis not present

## 2019-09-17 DIAGNOSIS — G309 Alzheimer's disease, unspecified: Secondary | ICD-10-CM | POA: Diagnosis not present

## 2019-09-22 NOTE — Progress Notes (Deleted)
[facility] [address] [place] [address] Phone: [phone] Subjective:    I'm seeing this patient by the request  of:  Sharon Seller, NP  CC:   [id number]   06/23/2019 Chronic problem with exacerbation.  Has responded well to Monovisc previously.  Bilateral steroid injection given last time.  Monovisc given today.  Discussed with patient about the elevation work and repeating steroid injections.  Patient does have significant dementia and patient was accompanied with husband as well as daughter.  Update 09/26/2019 Chelsea Maddox is a 84 y.o. female coming in with complaint of bilateral knee pain. Patient states        Past Medical History:  Diagnosis Date  . Allergy   . Arthritis   . Chicken pox   . COPD (chronic obstructive pulmonary disease) (HCC)   . Dementia (HCC)   . Dementia without behavioral disturbance (HCC)    unspecified type  . DJD (degenerative joint disease)   . Generalized headaches   . Hyperlipidemia   . Hypertension   . Hypertensive retinopathy of both eyes   . Kidney disease   . Low back pain   . Macular degeneration   . Major depressive disorder   . Neuropathy   . Nuclear sclerotic cataract of both eyes   . Osteoarthritis   . Peripheral neuropathy   . Retinal edema   . Thyroid disease   . UTI (urinary tract infection)   . Vertigo   . Vitamin D deficiency    Past Surgical History:  Procedure Laterality Date  . ABDOMINAL HYSTERECTOMY    . COLONOSCOPY  2012  . SACRAL NERVE STIMULATOR PLACEMENT  2018  . TRIGGER FINGER RELEASE     Social History   Socioeconomic History  . Marital status: Married    Spouse name: Not on file  . Number of children: 3  . Years of education: Not on file  . Highest education level: Not on file  Occupational History  . Not on file  Tobacco Use  . Smoking status: Never Smoker  . Smokeless tobacco: Never Used  Vaping Use  . Vaping Use: Never used  Substance and  Sexual Activity  . Alcohol use: Never  . Drug use: Never  . Sexual activity: Not Currently  Other Topics Concern  . Not on file  Social History Narrative   Social History      Diet?       Do you drink/eat things with caffeine? yes      Marital status?        married                            What year were you married? 1957      Do you live in a house, apartment, assisted living, condo, trailer, etc.? house      Is it one or more stories? 1      How many persons live in your home? 4      Do you have any pets in your home? (please list) 3 dogs, 1 cat      Highest level of education completed? Some college      Current or past profession: homemaker      Do you exercise?       no  Type & how often?      Advanced Directives      Do you have a living will? yes      Do you have a DNR form?                                  If not, do you want to discuss one? no      Do you have signed POA/HPOA for forms? yes      Functional Status      Do you have difficulty bathing or dressing yourself? yes      Do you have difficulty preparing food or eating?  yes      Do you have difficulty managing your medications? yes      Do you have difficulty managing your finances? yes      Do you have difficulty affording your medications? no   Social Determinants of Health   Financial Resource Strain:   . Difficulty of Paying Living Expenses: Not on file  Food Insecurity:   . Worried About Programme researcher, broadcasting/film/video in the Last Year: Not on file  . Ran Out of Food in the Last Year: Not on file  Transportation Needs:   . Lack of Transportation (Medical): Not on file  . Lack of Transportation (Non-Medical): Not on file  Physical Activity:   . Days of Exercise per Week: Not on file  . Minutes of Exercise per Session: Not on file  Stress:   . Feeling of Stress : Not on file  Social Connections:   . Frequency of Communication with Friends and Family: Not on file    . Frequency of Social Gatherings with Friends and Family: Not on file  . Attends Religious Services: Not on file  . Active Member of Clubs or Organizations: Not on file  . Attends Banker Meetings: Not on file  . Marital Status: Not on file   No Known Allergies Family History  Problem Relation Age of Onset  . Arthritis Mother   . Diabetes Mother   . Hearing loss Mother   . Hyperlipidemia Mother   . Hypertension Mother   . Aneurysm Mother   . Arthritis Father   . COPD Father   . Hearing loss Father   . Heart disease Father   . Hyperlipidemia Father   . Hypertension Father   . Kidney disease Father   . Anuerysm Father        aortic arch  . Dementia Father   . Arthritis Sister 37  . Hypertension Sister   . Macular degeneration Sister   . Aneurysm Sister        aortic  . Arthritis Sister   . Hearing loss Sister   . Hypertension Sister   . Aneurysm Sister        brain  . Early death Brother 69  . Hyperlipidemia Brother   . Hypertension Brother   . Aneurysm Brother        aortic  . Arthritis Brother   . Hypertension Brother   . Gout Brother   . Macular degeneration Brother   . Aneurysm Brother        aortic  . Other Brother 24       [place]  . Hyperlipidemia Daughter 56  . Hypertension Daughter   . Depression Daughter   . Arthritis Daughter   . Arthritis Daughter   .  Hypertension Son      Current Outpatient Medications (Cardiovascular):  .  losartan-hydrochlorothiazide (HYZAAR) 100-12.5 MG tablet, TAKE 1 TABLET BY MOUTH EVERY DAY  Current Outpatient Medications (Respiratory):  .  albuterol (PROVENTIL HFA;VENTOLIN HFA) 108 (90 Base) MCG/ACT inhaler, Inhale 1-2 puffs into the lungs every 6 (six) hours as needed for wheezing or shortness of breath.  Current Outpatient Medications (Analgesics):  .  aspirin EC 81 MG tablet, Take 1 tablet (81 mg total) by mouth daily. Swallow whole.   Current Outpatient Medications (Other):  Marland Kitchen   Cholecalciferol 125 MCG (5000 UT) capsule, Take 5,000 Units by mouth daily. .  DULoxetine (CYMBALTA) 60 MG capsule, Take 1 capsule (60 mg total) by mouth daily. .  Melatonin 5 MG CAPS, Take 1 capsule by mouth at bedtime. .  Misc Natural Products (TART CHERRY ADVANCED PO), Take 3,000 mg by mouth daily. .  Multiple Vitamins-Minerals (PRESERVISION AREDS PO), Take 1 capsule by mouth 2 (two) times daily. Marland Kitchen  nystatin (MYCOSTATIN/NYSTOP) powder, Apply 1 application topically 3 (three) times daily. .  QUEtiapine (SEROQUEL) 25 MG tablet, Take 1 tablet (25 mg total) by mouth at bedtime. Marland Kitchen  UNABLE TO FIND, Med Name: CBD Oil, 1/2 drop twice daily .  UNABLE TO FIND, Take 2 capsules by mouth daily. Med Name: Bertrum Sol   Reviewed prior external information including notes and imaging from  primary care provider As well as notes that were available from care everywhere and other healthcare systems.  Past medical history, social, surgical and family history all reviewed in electronic medical record.  No pertanent information unless stated regarding to the chief complaint.   Review of Systems:  No headache, visual changes, nausea, vomiting, diarrhea, constipation, dizziness, abdominal pain, skin rash, fevers, chills, night sweats, weight loss, swollen lymph nodes, body aches, joint swelling, chest pain, shortness of breath, mood changes. POSITIVE muscle aches  Objective  There were no vitals taken for this visit.   General: No apparent distress alert and oriented x3 mood and affect normal, dressed appropriately.  HEENT: Pupils equal, extraocular movements intact  Respiratory: Patient's speak in full sentences and does not appear short of breath  Cardiovascular: No lower extremity edema, non tender, no erythema  Neuro: Cranial nerves II through XII are intact, neurovascularly intact in all extremities with 2+ DTRs and 2+ pulses.  Gait normal with good balance and coordination.  MSK:  Non tender with full  range of motion and good stability and symmetric strength and tone of shoulders, elbows, wrist, hip, knee and ankles bilaterally.     Impression and Recommendations:     The above documentation has been reviewed and is accurate and complete Wilford Grist       Note: This dictation was prepared with Dragon dictation along with smaller phrase technology. Any transcriptional errors that result from this process are unintentional.

## 2019-09-25 ENCOUNTER — Ambulatory Visit: Payer: Medicare HMO | Admitting: Family Medicine

## 2019-09-26 ENCOUNTER — Ambulatory Visit: Payer: Medicare HMO | Admitting: Family Medicine

## 2019-09-29 ENCOUNTER — Ambulatory Visit: Payer: Medicare HMO | Admitting: Nurse Practitioner

## 2019-10-02 ENCOUNTER — Encounter: Payer: Self-pay | Admitting: Nurse Practitioner

## 2019-10-02 DIAGNOSIS — F419 Anxiety disorder, unspecified: Secondary | ICD-10-CM

## 2019-10-02 DIAGNOSIS — M545 Low back pain, unspecified: Secondary | ICD-10-CM

## 2019-10-03 MED ORDER — QUETIAPINE FUMARATE 25 MG PO TABS: 25.0000 mg | ORAL_TABLET | Freq: Every day | ORAL | 1 refills | Status: DC

## 2019-10-03 MED ORDER — LOSARTAN POTASSIUM-HCTZ 100-12.5 MG PO TABS: 1.0000 | ORAL_TABLET | Freq: Every day | ORAL | 1 refills | Status: DC

## 2019-10-03 MED ORDER — DULOXETINE HCL 60 MG PO CPEP: 60.0000 mg | ORAL_CAPSULE | Freq: Every day | ORAL | 1 refills | Status: DC

## 2019-10-17 DIAGNOSIS — R69 Illness, unspecified: Secondary | ICD-10-CM | POA: Diagnosis not present

## 2019-10-17 DIAGNOSIS — G309 Alzheimer's disease, unspecified: Secondary | ICD-10-CM | POA: Diagnosis not present

## 2019-10-17 DIAGNOSIS — M170 Bilateral primary osteoarthritis of knee: Secondary | ICD-10-CM | POA: Diagnosis not present

## 2019-10-17 DIAGNOSIS — J449 Chronic obstructive pulmonary disease, unspecified: Secondary | ICD-10-CM | POA: Diagnosis not present

## 2019-10-17 DIAGNOSIS — G894 Chronic pain syndrome: Secondary | ICD-10-CM | POA: Diagnosis not present

## 2019-10-17 DIAGNOSIS — I10 Essential (primary) hypertension: Secondary | ICD-10-CM | POA: Diagnosis not present

## 2019-10-17 DIAGNOSIS — G629 Polyneuropathy, unspecified: Secondary | ICD-10-CM | POA: Diagnosis not present

## 2019-10-17 DIAGNOSIS — M545 Low back pain, unspecified: Secondary | ICD-10-CM | POA: Diagnosis not present

## 2019-10-17 DIAGNOSIS — R911 Solitary pulmonary nodule: Secondary | ICD-10-CM | POA: Diagnosis not present

## 2019-10-20 ENCOUNTER — Telehealth: Payer: Self-pay

## 2019-10-20 DIAGNOSIS — I10 Essential (primary) hypertension: Secondary | ICD-10-CM | POA: Diagnosis not present

## 2019-10-20 DIAGNOSIS — M170 Bilateral primary osteoarthritis of knee: Secondary | ICD-10-CM | POA: Diagnosis not present

## 2019-10-20 DIAGNOSIS — J449 Chronic obstructive pulmonary disease, unspecified: Secondary | ICD-10-CM | POA: Diagnosis not present

## 2019-10-20 DIAGNOSIS — R911 Solitary pulmonary nodule: Secondary | ICD-10-CM | POA: Diagnosis not present

## 2019-10-20 DIAGNOSIS — G309 Alzheimer's disease, unspecified: Secondary | ICD-10-CM | POA: Diagnosis not present

## 2019-10-20 DIAGNOSIS — M545 Low back pain, unspecified: Secondary | ICD-10-CM | POA: Diagnosis not present

## 2019-10-20 DIAGNOSIS — G894 Chronic pain syndrome: Secondary | ICD-10-CM | POA: Diagnosis not present

## 2019-10-20 DIAGNOSIS — R69 Illness, unspecified: Secondary | ICD-10-CM | POA: Diagnosis not present

## 2019-10-20 DIAGNOSIS — G629 Polyneuropathy, unspecified: Secondary | ICD-10-CM | POA: Diagnosis not present

## 2019-10-20 NOTE — Telephone Encounter (Signed)
Noted thank you

## 2019-10-20 NOTE — Telephone Encounter (Signed)
Angie from [facility] called and stated that patient fell out of bed with no injuries. She has been falling a lot lately and wanted you to know.

## 2019-10-23 ENCOUNTER — Telehealth: Payer: Self-pay

## 2019-10-23 DIAGNOSIS — G309 Alzheimer's disease, unspecified: Secondary | ICD-10-CM | POA: Diagnosis not present

## 2019-10-23 DIAGNOSIS — R911 Solitary pulmonary nodule: Secondary | ICD-10-CM | POA: Diagnosis not present

## 2019-10-23 DIAGNOSIS — M545 Low back pain, unspecified: Secondary | ICD-10-CM | POA: Diagnosis not present

## 2019-10-23 DIAGNOSIS — G629 Polyneuropathy, unspecified: Secondary | ICD-10-CM | POA: Diagnosis not present

## 2019-10-23 DIAGNOSIS — G894 Chronic pain syndrome: Secondary | ICD-10-CM | POA: Diagnosis not present

## 2019-10-23 DIAGNOSIS — R69 Illness, unspecified: Secondary | ICD-10-CM | POA: Diagnosis not present

## 2019-10-23 DIAGNOSIS — M170 Bilateral primary osteoarthritis of knee: Secondary | ICD-10-CM | POA: Diagnosis not present

## 2019-10-23 DIAGNOSIS — I10 Essential (primary) hypertension: Secondary | ICD-10-CM | POA: Diagnosis not present

## 2019-10-23 DIAGNOSIS — J449 Chronic obstructive pulmonary disease, unspecified: Secondary | ICD-10-CM | POA: Diagnosis not present

## 2019-10-23 NOTE — Telephone Encounter (Signed)
Occupational Therapist Jettie Pagan from [facility] called and states that she needs orders for this patient. She request Occupational Therapy for the patient 2 week x 1 & 1 week x 3. She states that she will reaccess patient again after this to confirm if she needs more Occupational Therapy. Second order was for Speech Therapy evaluation because patient is having difficulty swallowing. Patient daughter is also requesting this. Message sent to PCP Janyth Contes Janene Harvey, NP Please Advise.

## 2019-10-24 DIAGNOSIS — M170 Bilateral primary osteoarthritis of knee: Secondary | ICD-10-CM | POA: Diagnosis not present

## 2019-10-24 DIAGNOSIS — G629 Polyneuropathy, unspecified: Secondary | ICD-10-CM | POA: Diagnosis not present

## 2019-10-24 DIAGNOSIS — J449 Chronic obstructive pulmonary disease, unspecified: Secondary | ICD-10-CM | POA: Diagnosis not present

## 2019-10-24 DIAGNOSIS — G309 Alzheimer's disease, unspecified: Secondary | ICD-10-CM | POA: Diagnosis not present

## 2019-10-24 DIAGNOSIS — R69 Illness, unspecified: Secondary | ICD-10-CM | POA: Diagnosis not present

## 2019-10-24 DIAGNOSIS — R911 Solitary pulmonary nodule: Secondary | ICD-10-CM | POA: Diagnosis not present

## 2019-10-24 DIAGNOSIS — G894 Chronic pain syndrome: Secondary | ICD-10-CM | POA: Diagnosis not present

## 2019-10-24 DIAGNOSIS — M545 Low back pain, unspecified: Secondary | ICD-10-CM | POA: Diagnosis not present

## 2019-10-24 DIAGNOSIS — I10 Essential (primary) hypertension: Secondary | ICD-10-CM | POA: Diagnosis not present

## 2019-10-24 NOTE — Telephone Encounter (Signed)
Okay to give orders as requested;  

## 2019-10-24 NOTE — Telephone Encounter (Signed)
Called back and orders confirmed with Jettie Pagan.

## 2019-10-26 DIAGNOSIS — G309 Alzheimer's disease, unspecified: Secondary | ICD-10-CM | POA: Diagnosis not present

## 2019-10-26 DIAGNOSIS — M545 Low back pain, unspecified: Secondary | ICD-10-CM | POA: Diagnosis not present

## 2019-10-26 DIAGNOSIS — G894 Chronic pain syndrome: Secondary | ICD-10-CM | POA: Diagnosis not present

## 2019-10-26 DIAGNOSIS — M170 Bilateral primary osteoarthritis of knee: Secondary | ICD-10-CM | POA: Diagnosis not present

## 2019-10-26 DIAGNOSIS — R911 Solitary pulmonary nodule: Secondary | ICD-10-CM | POA: Diagnosis not present

## 2019-10-26 DIAGNOSIS — R69 Illness, unspecified: Secondary | ICD-10-CM | POA: Diagnosis not present

## 2019-10-26 DIAGNOSIS — G629 Polyneuropathy, unspecified: Secondary | ICD-10-CM | POA: Diagnosis not present

## 2019-10-26 DIAGNOSIS — I10 Essential (primary) hypertension: Secondary | ICD-10-CM | POA: Diagnosis not present

## 2019-10-26 DIAGNOSIS — J449 Chronic obstructive pulmonary disease, unspecified: Secondary | ICD-10-CM | POA: Diagnosis not present

## 2019-10-27 DIAGNOSIS — M170 Bilateral primary osteoarthritis of knee: Secondary | ICD-10-CM | POA: Diagnosis not present

## 2019-10-27 DIAGNOSIS — J449 Chronic obstructive pulmonary disease, unspecified: Secondary | ICD-10-CM | POA: Diagnosis not present

## 2019-10-27 DIAGNOSIS — M545 Low back pain, unspecified: Secondary | ICD-10-CM | POA: Diagnosis not present

## 2019-10-27 DIAGNOSIS — R911 Solitary pulmonary nodule: Secondary | ICD-10-CM | POA: Diagnosis not present

## 2019-10-27 DIAGNOSIS — R69 Illness, unspecified: Secondary | ICD-10-CM | POA: Diagnosis not present

## 2019-10-27 DIAGNOSIS — I10 Essential (primary) hypertension: Secondary | ICD-10-CM | POA: Diagnosis not present

## 2019-10-27 DIAGNOSIS — G629 Polyneuropathy, unspecified: Secondary | ICD-10-CM | POA: Diagnosis not present

## 2019-10-27 DIAGNOSIS — G894 Chronic pain syndrome: Secondary | ICD-10-CM | POA: Diagnosis not present

## 2019-10-27 DIAGNOSIS — G309 Alzheimer's disease, unspecified: Secondary | ICD-10-CM | POA: Diagnosis not present

## 2019-10-29 DIAGNOSIS — R911 Solitary pulmonary nodule: Secondary | ICD-10-CM | POA: Diagnosis not present

## 2019-10-29 DIAGNOSIS — G894 Chronic pain syndrome: Secondary | ICD-10-CM | POA: Diagnosis not present

## 2019-10-29 DIAGNOSIS — R69 Illness, unspecified: Secondary | ICD-10-CM | POA: Diagnosis not present

## 2019-10-29 DIAGNOSIS — G629 Polyneuropathy, unspecified: Secondary | ICD-10-CM | POA: Diagnosis not present

## 2019-10-29 DIAGNOSIS — M170 Bilateral primary osteoarthritis of knee: Secondary | ICD-10-CM | POA: Diagnosis not present

## 2019-10-29 DIAGNOSIS — J449 Chronic obstructive pulmonary disease, unspecified: Secondary | ICD-10-CM | POA: Diagnosis not present

## 2019-10-29 DIAGNOSIS — I10 Essential (primary) hypertension: Secondary | ICD-10-CM | POA: Diagnosis not present

## 2019-10-29 DIAGNOSIS — M545 Low back pain, unspecified: Secondary | ICD-10-CM | POA: Diagnosis not present

## 2019-10-29 DIAGNOSIS — G309 Alzheimer's disease, unspecified: Secondary | ICD-10-CM | POA: Diagnosis not present

## 2019-10-31 DIAGNOSIS — G309 Alzheimer's disease, unspecified: Secondary | ICD-10-CM | POA: Diagnosis not present

## 2019-10-31 DIAGNOSIS — R911 Solitary pulmonary nodule: Secondary | ICD-10-CM | POA: Diagnosis not present

## 2019-10-31 DIAGNOSIS — J449 Chronic obstructive pulmonary disease, unspecified: Secondary | ICD-10-CM | POA: Diagnosis not present

## 2019-10-31 DIAGNOSIS — M545 Low back pain, unspecified: Secondary | ICD-10-CM | POA: Diagnosis not present

## 2019-10-31 DIAGNOSIS — G629 Polyneuropathy, unspecified: Secondary | ICD-10-CM | POA: Diagnosis not present

## 2019-10-31 DIAGNOSIS — M170 Bilateral primary osteoarthritis of knee: Secondary | ICD-10-CM | POA: Diagnosis not present

## 2019-10-31 DIAGNOSIS — G894 Chronic pain syndrome: Secondary | ICD-10-CM | POA: Diagnosis not present

## 2019-10-31 DIAGNOSIS — R69 Illness, unspecified: Secondary | ICD-10-CM | POA: Diagnosis not present

## 2019-10-31 DIAGNOSIS — I10 Essential (primary) hypertension: Secondary | ICD-10-CM | POA: Diagnosis not present

## 2019-11-01 DIAGNOSIS — R69 Illness, unspecified: Secondary | ICD-10-CM | POA: Diagnosis not present

## 2019-11-01 DIAGNOSIS — I10 Essential (primary) hypertension: Secondary | ICD-10-CM | POA: Diagnosis not present

## 2019-11-01 DIAGNOSIS — G629 Polyneuropathy, unspecified: Secondary | ICD-10-CM | POA: Diagnosis not present

## 2019-11-01 DIAGNOSIS — R911 Solitary pulmonary nodule: Secondary | ICD-10-CM | POA: Diagnosis not present

## 2019-11-01 DIAGNOSIS — J449 Chronic obstructive pulmonary disease, unspecified: Secondary | ICD-10-CM | POA: Diagnosis not present

## 2019-11-01 DIAGNOSIS — G894 Chronic pain syndrome: Secondary | ICD-10-CM | POA: Diagnosis not present

## 2019-11-01 DIAGNOSIS — M170 Bilateral primary osteoarthritis of knee: Secondary | ICD-10-CM | POA: Diagnosis not present

## 2019-11-01 DIAGNOSIS — M545 Low back pain, unspecified: Secondary | ICD-10-CM | POA: Diagnosis not present

## 2019-11-01 DIAGNOSIS — G309 Alzheimer's disease, unspecified: Secondary | ICD-10-CM | POA: Diagnosis not present

## 2019-11-02 DIAGNOSIS — M545 Low back pain, unspecified: Secondary | ICD-10-CM | POA: Diagnosis not present

## 2019-11-02 DIAGNOSIS — R69 Illness, unspecified: Secondary | ICD-10-CM | POA: Diagnosis not present

## 2019-11-02 DIAGNOSIS — I10 Essential (primary) hypertension: Secondary | ICD-10-CM | POA: Diagnosis not present

## 2019-11-02 DIAGNOSIS — M170 Bilateral primary osteoarthritis of knee: Secondary | ICD-10-CM | POA: Diagnosis not present

## 2019-11-02 DIAGNOSIS — G309 Alzheimer's disease, unspecified: Secondary | ICD-10-CM | POA: Diagnosis not present

## 2019-11-02 DIAGNOSIS — G629 Polyneuropathy, unspecified: Secondary | ICD-10-CM | POA: Diagnosis not present

## 2019-11-02 DIAGNOSIS — R911 Solitary pulmonary nodule: Secondary | ICD-10-CM | POA: Diagnosis not present

## 2019-11-02 DIAGNOSIS — J449 Chronic obstructive pulmonary disease, unspecified: Secondary | ICD-10-CM | POA: Diagnosis not present

## 2019-11-02 DIAGNOSIS — G894 Chronic pain syndrome: Secondary | ICD-10-CM | POA: Diagnosis not present

## 2019-11-05 DIAGNOSIS — G309 Alzheimer's disease, unspecified: Secondary | ICD-10-CM | POA: Diagnosis not present

## 2019-11-05 DIAGNOSIS — M170 Bilateral primary osteoarthritis of knee: Secondary | ICD-10-CM | POA: Diagnosis not present

## 2019-11-05 DIAGNOSIS — R911 Solitary pulmonary nodule: Secondary | ICD-10-CM | POA: Diagnosis not present

## 2019-11-05 DIAGNOSIS — M545 Low back pain, unspecified: Secondary | ICD-10-CM | POA: Diagnosis not present

## 2019-11-05 DIAGNOSIS — I10 Essential (primary) hypertension: Secondary | ICD-10-CM | POA: Diagnosis not present

## 2019-11-05 DIAGNOSIS — G629 Polyneuropathy, unspecified: Secondary | ICD-10-CM | POA: Diagnosis not present

## 2019-11-05 DIAGNOSIS — R69 Illness, unspecified: Secondary | ICD-10-CM | POA: Diagnosis not present

## 2019-11-05 DIAGNOSIS — G894 Chronic pain syndrome: Secondary | ICD-10-CM | POA: Diagnosis not present

## 2019-11-05 DIAGNOSIS — J449 Chronic obstructive pulmonary disease, unspecified: Secondary | ICD-10-CM | POA: Diagnosis not present

## 2019-11-06 DIAGNOSIS — M170 Bilateral primary osteoarthritis of knee: Secondary | ICD-10-CM | POA: Diagnosis not present

## 2019-11-06 DIAGNOSIS — R911 Solitary pulmonary nodule: Secondary | ICD-10-CM | POA: Diagnosis not present

## 2019-11-06 DIAGNOSIS — M545 Low back pain, unspecified: Secondary | ICD-10-CM | POA: Diagnosis not present

## 2019-11-06 DIAGNOSIS — I10 Essential (primary) hypertension: Secondary | ICD-10-CM | POA: Diagnosis not present

## 2019-11-06 DIAGNOSIS — G894 Chronic pain syndrome: Secondary | ICD-10-CM | POA: Diagnosis not present

## 2019-11-06 DIAGNOSIS — G629 Polyneuropathy, unspecified: Secondary | ICD-10-CM | POA: Diagnosis not present

## 2019-11-06 DIAGNOSIS — R69 Illness, unspecified: Secondary | ICD-10-CM | POA: Diagnosis not present

## 2019-11-06 DIAGNOSIS — J449 Chronic obstructive pulmonary disease, unspecified: Secondary | ICD-10-CM | POA: Diagnosis not present

## 2019-11-06 DIAGNOSIS — G309 Alzheimer's disease, unspecified: Secondary | ICD-10-CM | POA: Diagnosis not present

## 2019-11-08 DIAGNOSIS — R69 Illness, unspecified: Secondary | ICD-10-CM | POA: Diagnosis not present

## 2019-11-08 DIAGNOSIS — M170 Bilateral primary osteoarthritis of knee: Secondary | ICD-10-CM | POA: Diagnosis not present

## 2019-11-08 DIAGNOSIS — R911 Solitary pulmonary nodule: Secondary | ICD-10-CM | POA: Diagnosis not present

## 2019-11-08 DIAGNOSIS — G894 Chronic pain syndrome: Secondary | ICD-10-CM | POA: Diagnosis not present

## 2019-11-08 DIAGNOSIS — M545 Low back pain, unspecified: Secondary | ICD-10-CM | POA: Diagnosis not present

## 2019-11-08 DIAGNOSIS — G309 Alzheimer's disease, unspecified: Secondary | ICD-10-CM | POA: Diagnosis not present

## 2019-11-08 DIAGNOSIS — G629 Polyneuropathy, unspecified: Secondary | ICD-10-CM | POA: Diagnosis not present

## 2019-11-08 DIAGNOSIS — I10 Essential (primary) hypertension: Secondary | ICD-10-CM | POA: Diagnosis not present

## 2019-11-08 DIAGNOSIS — J449 Chronic obstructive pulmonary disease, unspecified: Secondary | ICD-10-CM | POA: Diagnosis not present

## 2019-11-09 DIAGNOSIS — G629 Polyneuropathy, unspecified: Secondary | ICD-10-CM | POA: Diagnosis not present

## 2019-11-09 DIAGNOSIS — M545 Low back pain, unspecified: Secondary | ICD-10-CM | POA: Diagnosis not present

## 2019-11-09 DIAGNOSIS — J449 Chronic obstructive pulmonary disease, unspecified: Secondary | ICD-10-CM | POA: Diagnosis not present

## 2019-11-09 DIAGNOSIS — M170 Bilateral primary osteoarthritis of knee: Secondary | ICD-10-CM | POA: Diagnosis not present

## 2019-11-09 DIAGNOSIS — I10 Essential (primary) hypertension: Secondary | ICD-10-CM | POA: Diagnosis not present

## 2019-11-09 DIAGNOSIS — G309 Alzheimer's disease, unspecified: Secondary | ICD-10-CM | POA: Diagnosis not present

## 2019-11-09 DIAGNOSIS — R911 Solitary pulmonary nodule: Secondary | ICD-10-CM | POA: Diagnosis not present

## 2019-11-09 DIAGNOSIS — G894 Chronic pain syndrome: Secondary | ICD-10-CM | POA: Diagnosis not present

## 2019-11-09 DIAGNOSIS — R69 Illness, unspecified: Secondary | ICD-10-CM | POA: Diagnosis not present

## 2019-11-13 DIAGNOSIS — M545 Low back pain, unspecified: Secondary | ICD-10-CM | POA: Diagnosis not present

## 2019-11-13 DIAGNOSIS — M170 Bilateral primary osteoarthritis of knee: Secondary | ICD-10-CM | POA: Diagnosis not present

## 2019-11-13 DIAGNOSIS — J449 Chronic obstructive pulmonary disease, unspecified: Secondary | ICD-10-CM | POA: Diagnosis not present

## 2019-11-13 DIAGNOSIS — G629 Polyneuropathy, unspecified: Secondary | ICD-10-CM | POA: Diagnosis not present

## 2019-11-13 DIAGNOSIS — I10 Essential (primary) hypertension: Secondary | ICD-10-CM | POA: Diagnosis not present

## 2019-11-13 DIAGNOSIS — G894 Chronic pain syndrome: Secondary | ICD-10-CM | POA: Diagnosis not present

## 2019-11-13 DIAGNOSIS — R911 Solitary pulmonary nodule: Secondary | ICD-10-CM | POA: Diagnosis not present

## 2019-11-13 DIAGNOSIS — R69 Illness, unspecified: Secondary | ICD-10-CM | POA: Diagnosis not present

## 2019-11-13 DIAGNOSIS — G309 Alzheimer's disease, unspecified: Secondary | ICD-10-CM | POA: Diagnosis not present

## 2019-11-14 DIAGNOSIS — G894 Chronic pain syndrome: Secondary | ICD-10-CM | POA: Diagnosis not present

## 2019-11-14 DIAGNOSIS — I10 Essential (primary) hypertension: Secondary | ICD-10-CM | POA: Diagnosis not present

## 2019-11-14 DIAGNOSIS — M545 Low back pain, unspecified: Secondary | ICD-10-CM | POA: Diagnosis not present

## 2019-11-14 DIAGNOSIS — M170 Bilateral primary osteoarthritis of knee: Secondary | ICD-10-CM | POA: Diagnosis not present

## 2019-11-14 DIAGNOSIS — R69 Illness, unspecified: Secondary | ICD-10-CM | POA: Diagnosis not present

## 2019-11-14 DIAGNOSIS — G309 Alzheimer's disease, unspecified: Secondary | ICD-10-CM | POA: Diagnosis not present

## 2019-11-14 DIAGNOSIS — J449 Chronic obstructive pulmonary disease, unspecified: Secondary | ICD-10-CM | POA: Diagnosis not present

## 2019-11-14 DIAGNOSIS — R911 Solitary pulmonary nodule: Secondary | ICD-10-CM | POA: Diagnosis not present

## 2019-11-14 DIAGNOSIS — G629 Polyneuropathy, unspecified: Secondary | ICD-10-CM | POA: Diagnosis not present

## 2019-11-24 ENCOUNTER — Telehealth: Payer: Self-pay | Admitting: *Deleted

## 2019-11-24 DIAGNOSIS — G459 Transient cerebral ischemic attack, unspecified: Secondary | ICD-10-CM

## 2019-11-24 DIAGNOSIS — R5381 Other malaise: Secondary | ICD-10-CM

## 2019-11-24 DIAGNOSIS — F0391 Unspecified dementia with behavioral disturbance: Secondary | ICD-10-CM

## 2019-11-24 NOTE — Telephone Encounter (Signed)
Orders placed.

## 2019-11-24 NOTE — Telephone Encounter (Signed)
Patient daughter called requesting a referral to be placed for Home Health with [facility].  Stated that it had to be stopped when a family member tested Positive for Covid and had to be placed on HOLD for over 4 weeks.  Daughter spoke with nurse at [facility] and they stated patient Needs new order placed for [facility].

## 2019-11-29 DIAGNOSIS — R69 Illness, unspecified: Secondary | ICD-10-CM | POA: Diagnosis not present

## 2019-12-12 ENCOUNTER — Telehealth: Payer: Self-pay

## 2019-12-12 ENCOUNTER — Telehealth (INDEPENDENT_AMBULATORY_CARE_PROVIDER_SITE_OTHER): Payer: Medicare HMO | Admitting: Nurse Practitioner

## 2019-12-12 ENCOUNTER — Other Ambulatory Visit: Payer: Self-pay

## 2019-12-12 ENCOUNTER — Encounter: Payer: Self-pay | Admitting: Nurse Practitioner

## 2019-12-12 DIAGNOSIS — F419 Anxiety disorder, unspecified: Secondary | ICD-10-CM | POA: Diagnosis not present

## 2019-12-12 DIAGNOSIS — R5381 Other malaise: Secondary | ICD-10-CM | POA: Diagnosis not present

## 2019-12-12 DIAGNOSIS — F0391 Unspecified dementia with behavioral disturbance: Secondary | ICD-10-CM

## 2019-12-12 DIAGNOSIS — R69 Illness, unspecified: Secondary | ICD-10-CM | POA: Diagnosis not present

## 2019-12-12 NOTE — Telephone Encounter (Signed)
Ms. Chelsea Maddox, Chelsea Maddox are scheduled for a virtual visit with your provider today.    Just as we do with appointments in the office, we must obtain your consent to participate.  Your consent will be active for this visit and any virtual visit you may have with one of our providers in the next 365 days.    If you have a MyChart account, I can also send a copy of this consent to you electronically.  All virtual visits are billed to your insurance company just like a traditional visit in the office.  As this is a virtual visit, video technology does not allow for your provider to perform a traditional examination.  This may limit your provider's ability to fully assess your condition.  If your provider identifies any concerns that need to be evaluated in person or the need to arrange testing such as labs, EKG, etc, we will make arrangements to do so.    Although advances in technology are sophisticated, we cannot ensure that it will always work on either your end or our end.  If the connection with a video visit is poor, we may have to switch to a telephone visit.  With either a video or telephone visit, we are not always able to ensure that we have a secure connection.   I need to obtain your verbal consent now.   Are you willing to proceed with your visit today?   Chelsea Maddox , Daughter [name], has provided verbal consent on 12/12/2019 for a virtual visit (video or telephone).   Elveria Royals, CMA 12/12/2019  11:10 AM

## 2019-12-12 NOTE — Progress Notes (Signed)
This service is provided via telemedicine  No vital signs collected/recorded due to the encounter was a telemedicine visit.   Location of patient (ex: home, work): Home  Patient's daughter consents to a telephone visit: Yes, see encounter dated 12/12/2019  Location of the provider (ex: office, home):  [employer]  Name of any referring provider:  N/A  Names of all persons participating in the telemedicine service and their role in the encounter:  Abbey Chatters, Nurse  Practitioner, Elveria Royals, CMA, and patient's daughter  . Time spent on call: 15 minutes with medical assistant.

## 2019-12-12 NOTE — Progress Notes (Signed)
Careteam: Patient Care Team: Sharon Seller, NP as PCP - General (Geriatric Medicine) Judi Saa, DO as Consulting Physician (Family Medicine) Eber Jones, MD as Referring Physician (Ophthalmology)  Advanced Directive information    No Known Allergies  Chief Complaint  Patient presents with  . Acute Visit    Patient needs new home health referral.According to daughter, Tina,patient needs new referral because when the other one was suppose to start father contacted COVID which delayed care and didn't get full number of weeks for care. Patient is continuing to decline mentally and physically. Trying to find the best way to keep her moving and doing things physically. Patient needs help with daily activities. Patient also needs order for wheelchair.Now having to crush patients medications in order for her take     HPI: Patient is a 84 y.o. female for follow up.  Pt's husband had COVID but she never had symptoms.  Home health was ordered but placed on hold due to COVID in the home Pt has become less ambulatory and conversations are not there like they used to be.  Not feeding herself.  She had 4 weeks of PT/OT and the plan was 9 weeks.  Daughter was not aware they were going to be discharged but they still are having problems with transfers, feedings, progressive weakness No recent falls.   More anxious when she is out but does like to be inside all the day.  Daughter had to use wheelchair that she borrowed to get her out as she is unable to walk any distances due to balance and debility.  She is able to walk around the house fine, uses walker occasionally but has some issues with perception.  Resting beter Review of Systems:  Review of Systems  Unable to perform ROS: Dementia    Past Medical History:  Diagnosis Date  . Allergy   . Arthritis   . Chicken pox   . COPD (chronic obstructive pulmonary disease) (HCC)   . Dementia (HCC)   . Dementia without  behavioral disturbance (HCC)    unspecified type  . DJD (degenerative joint disease)   . Generalized headaches   . Hyperlipidemia   . Hypertension   . Hypertensive retinopathy of both eyes   . Kidney disease   . Low back pain   . Macular degeneration   . Major depressive disorder   . Neuropathy   . Nuclear sclerotic cataract of both eyes   . Osteoarthritis   . Peripheral neuropathy   . Retinal edema   . Thyroid disease   . UTI (urinary tract infection)   . Vertigo   . Vitamin D deficiency    Past Surgical History:  Procedure Laterality Date  . ABDOMINAL HYSTERECTOMY    . COLONOSCOPY  2012  . SACRAL NERVE STIMULATOR PLACEMENT  2018  . TRIGGER FINGER RELEASE     Social History:   reports that she has never smoked. She has never used smokeless tobacco. She reports that she does not drink alcohol and does not use drugs.  Family History  Problem Relation Age of Onset  . Arthritis Mother   . Diabetes Mother   . Hearing loss Mother   . Hyperlipidemia Mother   . Hypertension Mother   . Aneurysm Mother   . Arthritis Father   . COPD Father   . Hearing loss Father   . Heart disease Father   . Hyperlipidemia Father   . Hypertension Father   . Kidney  disease Father   . Anuerysm Father        aortic arch  . Dementia Father   . Arthritis Sister 67  . Hypertension Sister   . Macular degeneration Sister   . Aneurysm Sister        aortic  . Arthritis Sister   . Hearing loss Sister   . Hypertension Sister   . Aneurysm Sister        brain  . Early death Brother 76  . Hyperlipidemia Brother   . Hypertension Brother   . Aneurysm Brother        aortic  . Arthritis Brother   . Hypertension Brother   . Gout Brother   . Macular degeneration Brother   . Aneurysm Brother        aortic  . Other Brother 8       [place]  . Hyperlipidemia Daughter 53  . Hypertension Daughter   . Depression Daughter   . Arthritis Daughter   . Arthritis Daughter   . Hypertension Son       Medications: Patient's Medications  New Prescriptions   No medications on file  Previous Medications   ALBUTEROL (PROVENTIL HFA;VENTOLIN HFA) 108 (90 BASE) MCG/ACT INHALER    Inhale 1-2 puffs into the lungs every 6 (six) hours as needed for wheezing or shortness of breath.   ASPIRIN EC 81 MG TABLET    Take 1 tablet (81 mg total) by mouth daily. Swallow whole.   CHOLECALCIFEROL 125 MCG (5000 UT) CAPSULE    Take 5,000 Units by mouth daily.   DULOXETINE (CYMBALTA) 60 MG CAPSULE    Take 1 capsule (60 mg total) by mouth daily.   LOSARTAN-HYDROCHLOROTHIAZIDE (HYZAAR) 100-12.5 MG TABLET    Take 1 tablet by mouth daily.   MELATONIN 5 MG CHEW    Chew 1 capsule by mouth at bedtime.    MISC NATURAL PRODUCTS (TART CHERRY ADVANCED PO)    Take 3,000 mg by mouth daily.   NYSTATIN (MYCOSTATIN/NYSTOP) POWDER    Apply 1 application topically 3 (three) times daily.   QUETIAPINE (SEROQUEL) 25 MG TABLET    Take 1 tablet (25 mg total) by mouth at bedtime.   UNABLE TO FIND    Med Name: CBD Oil, 1/2 drop twice daily   UNABLE TO FIND    Take 2 capsules by mouth daily. Med Name: NeruoQ   UNABLE TO FIND    1,000 mg in the morning and at bedtime. Magnesium L theronate 1,000 units in the morning and 1,000 units in the evening.  Modified Medications   No medications on file  Discontinued Medications   MULTIPLE VITAMINS-MINERALS (PRESERVISION AREDS PO)    Take 1 capsule by mouth 2 (two) times daily.    Physical Exam:  There were no vitals filed for this visit. There is no height or weight on file to calculate BMI. Wt Readings from Last 3 Encounters:  09/07/19 151 lb (68.5 kg)  08/07/19 156 lb (70.8 kg)  06/28/19 153 lb (69.4 kg)      Labs reviewed: Basic Metabolic Panel: Recent Labs    03/29/19 1436  NA 142  K 4.3  CL 106  CO2 30  GLUCOSE 92  BUN 21  CREATININE 1.24*  CALCIUM 9.6  TSH 2.25   Liver Function Tests: Recent Labs    03/29/19 1436  AST 15  ALT 9  BILITOT 0.6  PROT 6.8    No results for input(s): LIPASE, AMYLASE in the last  8760 hours. No results for input(s): AMMONIA in the last 8760 hours. CBC: Recent Labs    03/29/19 1436  WBC 6.5  NEUTROABS 4,713  HGB 11.7  HCT 36.1  MCV 89.1  PLT 262   Lipid Panel: No results for input(s): CHOL, HDL, LDLCALC, TRIG, CHOLHDL, LDLDIRECT in the last 8760 hours. TSH: Recent Labs    03/29/19 1436  TSH 2.25   A1C: No results found for: HGBA1C   Assessment/Plan 1. Dementia with behavioral disturbance, unspecified dementia type (HCC) -progressive decline, supportive care with family and caregivers. Ongoing weakness. Needs PT for strengthening.  - Ambulatory referral to Home Health  2. Debility -needing PT for strengthen, safe transfer.  - Ambulatory referral to Home Health  3. Anxiety -ongoing, continues on cymbalta daily  Next appt: 02/05/2020 Janene Harvey. Biagio Borg  [facility] [phone]    Virtual Visit via Earleen Reaper  I connected with patient on 12/12/19 at 11:30 AM EST by video and verified that I am speaking with the correct person using two identifiers.  Location: Patient: home Provider: [facility].    I discussed the limitations, risks, security and privacy concerns of performing an evaluation and management service by telephone and the availability of in person appointments. I also discussed with the patient that there may be a patient responsible charge related to this service. The patient expressed understanding and agreed to proceed.   I discussed the assessment and treatment plan with the patient. The patient was provided an opportunity to ask questions and all were answered. The patient agreed with the plan and demonstrated an understanding of the instructions.   The patient was advised to call back or seek an in-person evaluation if the symptoms worsen or if the condition fails to improve as anticipated.  I provided 15 minutes of non-face-to-face time  during this encounter.  Janene Harvey. Biagio Borg Avs printed and mailed

## 2019-12-15 ENCOUNTER — Encounter: Payer: Self-pay | Admitting: Nurse Practitioner

## 2019-12-15 DIAGNOSIS — F0391 Unspecified dementia with behavioral disturbance: Secondary | ICD-10-CM

## 2019-12-15 DIAGNOSIS — R5381 Other malaise: Secondary | ICD-10-CM

## 2019-12-15 DIAGNOSIS — G309 Alzheimer's disease, unspecified: Secondary | ICD-10-CM | POA: Diagnosis not present

## 2019-12-15 DIAGNOSIS — M1990 Unspecified osteoarthritis, unspecified site: Secondary | ICD-10-CM | POA: Diagnosis not present

## 2019-12-15 DIAGNOSIS — I2510 Atherosclerotic heart disease of native coronary artery without angina pectoris: Secondary | ICD-10-CM | POA: Diagnosis not present

## 2019-12-15 DIAGNOSIS — F0280 Dementia in other diseases classified elsewhere without behavioral disturbance: Secondary | ICD-10-CM

## 2019-12-15 DIAGNOSIS — I119 Hypertensive heart disease without heart failure: Secondary | ICD-10-CM | POA: Diagnosis not present

## 2019-12-15 DIAGNOSIS — F329 Major depressive disorder, single episode, unspecified: Secondary | ICD-10-CM

## 2019-12-15 DIAGNOSIS — G629 Polyneuropathy, unspecified: Secondary | ICD-10-CM | POA: Diagnosis not present

## 2019-12-15 DIAGNOSIS — R69 Illness, unspecified: Secondary | ICD-10-CM | POA: Diagnosis not present

## 2019-12-15 DIAGNOSIS — J449 Chronic obstructive pulmonary disease, unspecified: Secondary | ICD-10-CM | POA: Diagnosis not present

## 2019-12-15 DIAGNOSIS — G894 Chronic pain syndrome: Secondary | ICD-10-CM | POA: Diagnosis not present

## 2019-12-18 DIAGNOSIS — G629 Polyneuropathy, unspecified: Secondary | ICD-10-CM | POA: Diagnosis not present

## 2019-12-18 DIAGNOSIS — G309 Alzheimer's disease, unspecified: Secondary | ICD-10-CM | POA: Diagnosis not present

## 2019-12-18 DIAGNOSIS — J449 Chronic obstructive pulmonary disease, unspecified: Secondary | ICD-10-CM | POA: Diagnosis not present

## 2019-12-18 DIAGNOSIS — M1990 Unspecified osteoarthritis, unspecified site: Secondary | ICD-10-CM | POA: Diagnosis not present

## 2019-12-18 DIAGNOSIS — I119 Hypertensive heart disease without heart failure: Secondary | ICD-10-CM | POA: Diagnosis not present

## 2019-12-18 DIAGNOSIS — I2510 Atherosclerotic heart disease of native coronary artery without angina pectoris: Secondary | ICD-10-CM | POA: Diagnosis not present

## 2019-12-18 DIAGNOSIS — G894 Chronic pain syndrome: Secondary | ICD-10-CM | POA: Diagnosis not present

## 2019-12-18 DIAGNOSIS — R69 Illness, unspecified: Secondary | ICD-10-CM | POA: Diagnosis not present

## 2019-12-18 NOTE — Telephone Encounter (Signed)
Received order. Faxed to [facility]: [phone]

## 2019-12-20 ENCOUNTER — Telehealth: Payer: Self-pay | Admitting: *Deleted

## 2019-12-20 DIAGNOSIS — R69 Illness, unspecified: Secondary | ICD-10-CM | POA: Diagnosis not present

## 2019-12-20 DIAGNOSIS — I2510 Atherosclerotic heart disease of native coronary artery without angina pectoris: Secondary | ICD-10-CM | POA: Diagnosis not present

## 2019-12-20 DIAGNOSIS — G309 Alzheimer's disease, unspecified: Secondary | ICD-10-CM | POA: Diagnosis not present

## 2019-12-20 DIAGNOSIS — G629 Polyneuropathy, unspecified: Secondary | ICD-10-CM | POA: Diagnosis not present

## 2019-12-20 DIAGNOSIS — M1990 Unspecified osteoarthritis, unspecified site: Secondary | ICD-10-CM | POA: Diagnosis not present

## 2019-12-20 DIAGNOSIS — J449 Chronic obstructive pulmonary disease, unspecified: Secondary | ICD-10-CM | POA: Diagnosis not present

## 2019-12-20 DIAGNOSIS — I119 Hypertensive heart disease without heart failure: Secondary | ICD-10-CM | POA: Diagnosis not present

## 2019-12-20 DIAGNOSIS — G894 Chronic pain syndrome: Secondary | ICD-10-CM | POA: Diagnosis not present

## 2019-12-20 NOTE — Telephone Encounter (Signed)
Noted thank you

## 2019-12-20 NOTE — Telephone Encounter (Signed)
Megan with [facility] called and stated that she just wanted to let you know that patient fell this morning. Stated that she slid off of the bed. No Injury noted.

## 2019-12-22 DIAGNOSIS — G309 Alzheimer's disease, unspecified: Secondary | ICD-10-CM | POA: Diagnosis not present

## 2019-12-22 DIAGNOSIS — I119 Hypertensive heart disease without heart failure: Secondary | ICD-10-CM | POA: Diagnosis not present

## 2019-12-22 DIAGNOSIS — R69 Illness, unspecified: Secondary | ICD-10-CM | POA: Diagnosis not present

## 2019-12-22 DIAGNOSIS — M1990 Unspecified osteoarthritis, unspecified site: Secondary | ICD-10-CM | POA: Diagnosis not present

## 2019-12-22 DIAGNOSIS — I2510 Atherosclerotic heart disease of native coronary artery without angina pectoris: Secondary | ICD-10-CM | POA: Diagnosis not present

## 2019-12-22 DIAGNOSIS — G629 Polyneuropathy, unspecified: Secondary | ICD-10-CM | POA: Diagnosis not present

## 2019-12-22 DIAGNOSIS — J449 Chronic obstructive pulmonary disease, unspecified: Secondary | ICD-10-CM | POA: Diagnosis not present

## 2019-12-22 DIAGNOSIS — G894 Chronic pain syndrome: Secondary | ICD-10-CM | POA: Diagnosis not present

## 2019-12-22 NOTE — Telephone Encounter (Signed)
Chelsea Maddox, daughter called and stated that [name] with [name] did not receive the order.  Refaxed.  Daughter is going to call [name] with [name] to confirm fax number.

## 2019-12-22 NOTE — Telephone Encounter (Signed)
Regino Schultze called back and stated that the correct fax number is Fax: [phone] [facility]

## 2019-12-24 DIAGNOSIS — I2510 Atherosclerotic heart disease of native coronary artery without angina pectoris: Secondary | ICD-10-CM | POA: Diagnosis not present

## 2019-12-24 DIAGNOSIS — G894 Chronic pain syndrome: Secondary | ICD-10-CM | POA: Diagnosis not present

## 2019-12-24 DIAGNOSIS — G309 Alzheimer's disease, unspecified: Secondary | ICD-10-CM | POA: Diagnosis not present

## 2019-12-24 DIAGNOSIS — J449 Chronic obstructive pulmonary disease, unspecified: Secondary | ICD-10-CM | POA: Diagnosis not present

## 2019-12-24 DIAGNOSIS — M1990 Unspecified osteoarthritis, unspecified site: Secondary | ICD-10-CM | POA: Diagnosis not present

## 2019-12-24 DIAGNOSIS — I119 Hypertensive heart disease without heart failure: Secondary | ICD-10-CM | POA: Diagnosis not present

## 2019-12-24 DIAGNOSIS — G629 Polyneuropathy, unspecified: Secondary | ICD-10-CM | POA: Diagnosis not present

## 2019-12-24 DIAGNOSIS — R69 Illness, unspecified: Secondary | ICD-10-CM | POA: Diagnosis not present

## 2019-12-25 ENCOUNTER — Telehealth: Payer: Self-pay | Admitting: *Deleted

## 2019-12-25 NOTE — Telephone Encounter (Signed)
Received Detailed Written Order Form CMN from [facility] [phone] Fax: [phone] to fill out for Transport Chair.  Placed in Jessica's folder to review and sign. Attached last Encounter OV Notes. To be faxed back once signed.

## 2019-12-26 DIAGNOSIS — G629 Polyneuropathy, unspecified: Secondary | ICD-10-CM | POA: Diagnosis not present

## 2019-12-26 DIAGNOSIS — R69 Illness, unspecified: Secondary | ICD-10-CM | POA: Diagnosis not present

## 2019-12-26 DIAGNOSIS — R54 Age-related physical debility: Secondary | ICD-10-CM | POA: Diagnosis not present

## 2019-12-26 DIAGNOSIS — I2510 Atherosclerotic heart disease of native coronary artery without angina pectoris: Secondary | ICD-10-CM | POA: Diagnosis not present

## 2019-12-26 DIAGNOSIS — G309 Alzheimer's disease, unspecified: Secondary | ICD-10-CM | POA: Diagnosis not present

## 2019-12-26 DIAGNOSIS — M1990 Unspecified osteoarthritis, unspecified site: Secondary | ICD-10-CM | POA: Diagnosis not present

## 2019-12-26 DIAGNOSIS — J449 Chronic obstructive pulmonary disease, unspecified: Secondary | ICD-10-CM | POA: Diagnosis not present

## 2019-12-26 DIAGNOSIS — I119 Hypertensive heart disease without heart failure: Secondary | ICD-10-CM | POA: Diagnosis not present

## 2019-12-26 DIAGNOSIS — G894 Chronic pain syndrome: Secondary | ICD-10-CM | POA: Diagnosis not present

## 2019-12-27 DIAGNOSIS — R69 Illness, unspecified: Secondary | ICD-10-CM | POA: Diagnosis not present

## 2019-12-27 DIAGNOSIS — G309 Alzheimer's disease, unspecified: Secondary | ICD-10-CM | POA: Diagnosis not present

## 2019-12-27 DIAGNOSIS — I119 Hypertensive heart disease without heart failure: Secondary | ICD-10-CM | POA: Diagnosis not present

## 2019-12-27 DIAGNOSIS — G629 Polyneuropathy, unspecified: Secondary | ICD-10-CM | POA: Diagnosis not present

## 2019-12-27 DIAGNOSIS — J449 Chronic obstructive pulmonary disease, unspecified: Secondary | ICD-10-CM | POA: Diagnosis not present

## 2019-12-27 DIAGNOSIS — G894 Chronic pain syndrome: Secondary | ICD-10-CM | POA: Diagnosis not present

## 2019-12-27 DIAGNOSIS — M1990 Unspecified osteoarthritis, unspecified site: Secondary | ICD-10-CM | POA: Diagnosis not present

## 2019-12-27 DIAGNOSIS — I2510 Atherosclerotic heart disease of native coronary artery without angina pectoris: Secondary | ICD-10-CM | POA: Diagnosis not present

## 2019-12-28 DIAGNOSIS — R69 Illness, unspecified: Secondary | ICD-10-CM | POA: Diagnosis not present

## 2019-12-28 DIAGNOSIS — G894 Chronic pain syndrome: Secondary | ICD-10-CM | POA: Diagnosis not present

## 2019-12-28 DIAGNOSIS — I2510 Atherosclerotic heart disease of native coronary artery without angina pectoris: Secondary | ICD-10-CM | POA: Diagnosis not present

## 2019-12-28 DIAGNOSIS — G629 Polyneuropathy, unspecified: Secondary | ICD-10-CM | POA: Diagnosis not present

## 2019-12-28 DIAGNOSIS — J449 Chronic obstructive pulmonary disease, unspecified: Secondary | ICD-10-CM | POA: Diagnosis not present

## 2019-12-28 DIAGNOSIS — G309 Alzheimer's disease, unspecified: Secondary | ICD-10-CM | POA: Diagnosis not present

## 2019-12-28 DIAGNOSIS — I119 Hypertensive heart disease without heart failure: Secondary | ICD-10-CM | POA: Diagnosis not present

## 2019-12-28 DIAGNOSIS — M1990 Unspecified osteoarthritis, unspecified site: Secondary | ICD-10-CM | POA: Diagnosis not present

## 2019-12-29 DIAGNOSIS — G629 Polyneuropathy, unspecified: Secondary | ICD-10-CM | POA: Diagnosis not present

## 2019-12-29 DIAGNOSIS — I2510 Atherosclerotic heart disease of native coronary artery without angina pectoris: Secondary | ICD-10-CM | POA: Diagnosis not present

## 2019-12-29 DIAGNOSIS — G894 Chronic pain syndrome: Secondary | ICD-10-CM | POA: Diagnosis not present

## 2019-12-29 DIAGNOSIS — R69 Illness, unspecified: Secondary | ICD-10-CM | POA: Diagnosis not present

## 2019-12-29 DIAGNOSIS — J449 Chronic obstructive pulmonary disease, unspecified: Secondary | ICD-10-CM | POA: Diagnosis not present

## 2019-12-29 DIAGNOSIS — M1990 Unspecified osteoarthritis, unspecified site: Secondary | ICD-10-CM | POA: Diagnosis not present

## 2019-12-29 DIAGNOSIS — I119 Hypertensive heart disease without heart failure: Secondary | ICD-10-CM | POA: Diagnosis not present

## 2019-12-29 DIAGNOSIS — G309 Alzheimer's disease, unspecified: Secondary | ICD-10-CM | POA: Diagnosis not present

## 2020-01-01 DIAGNOSIS — G629 Polyneuropathy, unspecified: Secondary | ICD-10-CM | POA: Diagnosis not present

## 2020-01-01 DIAGNOSIS — I119 Hypertensive heart disease without heart failure: Secondary | ICD-10-CM | POA: Diagnosis not present

## 2020-01-01 DIAGNOSIS — M1990 Unspecified osteoarthritis, unspecified site: Secondary | ICD-10-CM | POA: Diagnosis not present

## 2020-01-01 DIAGNOSIS — J449 Chronic obstructive pulmonary disease, unspecified: Secondary | ICD-10-CM | POA: Diagnosis not present

## 2020-01-01 DIAGNOSIS — I2510 Atherosclerotic heart disease of native coronary artery without angina pectoris: Secondary | ICD-10-CM | POA: Diagnosis not present

## 2020-01-01 DIAGNOSIS — R69 Illness, unspecified: Secondary | ICD-10-CM | POA: Diagnosis not present

## 2020-01-01 DIAGNOSIS — G894 Chronic pain syndrome: Secondary | ICD-10-CM | POA: Diagnosis not present

## 2020-01-01 DIAGNOSIS — G309 Alzheimer's disease, unspecified: Secondary | ICD-10-CM | POA: Diagnosis not present

## 2020-01-03 DIAGNOSIS — I119 Hypertensive heart disease without heart failure: Secondary | ICD-10-CM | POA: Diagnosis not present

## 2020-01-03 DIAGNOSIS — I2510 Atherosclerotic heart disease of native coronary artery without angina pectoris: Secondary | ICD-10-CM | POA: Diagnosis not present

## 2020-01-03 DIAGNOSIS — G629 Polyneuropathy, unspecified: Secondary | ICD-10-CM | POA: Diagnosis not present

## 2020-01-03 DIAGNOSIS — G894 Chronic pain syndrome: Secondary | ICD-10-CM | POA: Diagnosis not present

## 2020-01-03 DIAGNOSIS — G309 Alzheimer's disease, unspecified: Secondary | ICD-10-CM | POA: Diagnosis not present

## 2020-01-03 DIAGNOSIS — J449 Chronic obstructive pulmonary disease, unspecified: Secondary | ICD-10-CM | POA: Diagnosis not present

## 2020-01-03 DIAGNOSIS — R69 Illness, unspecified: Secondary | ICD-10-CM | POA: Diagnosis not present

## 2020-01-03 DIAGNOSIS — M1990 Unspecified osteoarthritis, unspecified site: Secondary | ICD-10-CM | POA: Diagnosis not present

## 2020-01-04 DIAGNOSIS — I119 Hypertensive heart disease without heart failure: Secondary | ICD-10-CM | POA: Diagnosis not present

## 2020-01-04 DIAGNOSIS — M1990 Unspecified osteoarthritis, unspecified site: Secondary | ICD-10-CM | POA: Diagnosis not present

## 2020-01-04 DIAGNOSIS — G309 Alzheimer's disease, unspecified: Secondary | ICD-10-CM | POA: Diagnosis not present

## 2020-01-04 DIAGNOSIS — J449 Chronic obstructive pulmonary disease, unspecified: Secondary | ICD-10-CM | POA: Diagnosis not present

## 2020-01-04 DIAGNOSIS — I2510 Atherosclerotic heart disease of native coronary artery without angina pectoris: Secondary | ICD-10-CM | POA: Diagnosis not present

## 2020-01-04 DIAGNOSIS — R69 Illness, unspecified: Secondary | ICD-10-CM | POA: Diagnosis not present

## 2020-01-04 DIAGNOSIS — G629 Polyneuropathy, unspecified: Secondary | ICD-10-CM | POA: Diagnosis not present

## 2020-01-04 DIAGNOSIS — G894 Chronic pain syndrome: Secondary | ICD-10-CM | POA: Diagnosis not present

## 2020-01-07 DIAGNOSIS — G309 Alzheimer's disease, unspecified: Secondary | ICD-10-CM | POA: Diagnosis not present

## 2020-01-07 DIAGNOSIS — I2510 Atherosclerotic heart disease of native coronary artery without angina pectoris: Secondary | ICD-10-CM | POA: Diagnosis not present

## 2020-01-07 DIAGNOSIS — R69 Illness, unspecified: Secondary | ICD-10-CM | POA: Diagnosis not present

## 2020-01-07 DIAGNOSIS — I119 Hypertensive heart disease without heart failure: Secondary | ICD-10-CM | POA: Diagnosis not present

## 2020-01-07 DIAGNOSIS — M1990 Unspecified osteoarthritis, unspecified site: Secondary | ICD-10-CM | POA: Diagnosis not present

## 2020-01-07 DIAGNOSIS — J449 Chronic obstructive pulmonary disease, unspecified: Secondary | ICD-10-CM | POA: Diagnosis not present

## 2020-01-07 DIAGNOSIS — G629 Polyneuropathy, unspecified: Secondary | ICD-10-CM | POA: Diagnosis not present

## 2020-01-07 DIAGNOSIS — G894 Chronic pain syndrome: Secondary | ICD-10-CM | POA: Diagnosis not present

## 2020-01-09 DIAGNOSIS — G309 Alzheimer's disease, unspecified: Secondary | ICD-10-CM | POA: Diagnosis not present

## 2020-01-09 DIAGNOSIS — G629 Polyneuropathy, unspecified: Secondary | ICD-10-CM | POA: Diagnosis not present

## 2020-01-09 DIAGNOSIS — I119 Hypertensive heart disease without heart failure: Secondary | ICD-10-CM | POA: Diagnosis not present

## 2020-01-09 DIAGNOSIS — R69 Illness, unspecified: Secondary | ICD-10-CM | POA: Diagnosis not present

## 2020-01-09 DIAGNOSIS — I2510 Atherosclerotic heart disease of native coronary artery without angina pectoris: Secondary | ICD-10-CM | POA: Diagnosis not present

## 2020-01-09 DIAGNOSIS — M1990 Unspecified osteoarthritis, unspecified site: Secondary | ICD-10-CM | POA: Diagnosis not present

## 2020-01-09 DIAGNOSIS — J449 Chronic obstructive pulmonary disease, unspecified: Secondary | ICD-10-CM | POA: Diagnosis not present

## 2020-01-09 DIAGNOSIS — G894 Chronic pain syndrome: Secondary | ICD-10-CM | POA: Diagnosis not present

## 2020-01-11 DIAGNOSIS — G309 Alzheimer's disease, unspecified: Secondary | ICD-10-CM | POA: Diagnosis not present

## 2020-01-11 DIAGNOSIS — I119 Hypertensive heart disease without heart failure: Secondary | ICD-10-CM | POA: Diagnosis not present

## 2020-01-11 DIAGNOSIS — R69 Illness, unspecified: Secondary | ICD-10-CM | POA: Diagnosis not present

## 2020-01-11 DIAGNOSIS — G629 Polyneuropathy, unspecified: Secondary | ICD-10-CM | POA: Diagnosis not present

## 2020-01-11 DIAGNOSIS — G894 Chronic pain syndrome: Secondary | ICD-10-CM | POA: Diagnosis not present

## 2020-01-11 DIAGNOSIS — I2510 Atherosclerotic heart disease of native coronary artery without angina pectoris: Secondary | ICD-10-CM | POA: Diagnosis not present

## 2020-01-11 DIAGNOSIS — J449 Chronic obstructive pulmonary disease, unspecified: Secondary | ICD-10-CM | POA: Diagnosis not present

## 2020-01-11 DIAGNOSIS — M1990 Unspecified osteoarthritis, unspecified site: Secondary | ICD-10-CM | POA: Diagnosis not present

## 2020-01-16 DIAGNOSIS — G894 Chronic pain syndrome: Secondary | ICD-10-CM | POA: Diagnosis not present

## 2020-01-16 DIAGNOSIS — R69 Illness, unspecified: Secondary | ICD-10-CM | POA: Diagnosis not present

## 2020-01-16 DIAGNOSIS — I119 Hypertensive heart disease without heart failure: Secondary | ICD-10-CM | POA: Diagnosis not present

## 2020-01-16 DIAGNOSIS — M1990 Unspecified osteoarthritis, unspecified site: Secondary | ICD-10-CM | POA: Diagnosis not present

## 2020-01-16 DIAGNOSIS — G309 Alzheimer's disease, unspecified: Secondary | ICD-10-CM | POA: Diagnosis not present

## 2020-01-16 DIAGNOSIS — G629 Polyneuropathy, unspecified: Secondary | ICD-10-CM | POA: Diagnosis not present

## 2020-01-16 DIAGNOSIS — I2510 Atherosclerotic heart disease of native coronary artery without angina pectoris: Secondary | ICD-10-CM | POA: Diagnosis not present

## 2020-01-16 DIAGNOSIS — J449 Chronic obstructive pulmonary disease, unspecified: Secondary | ICD-10-CM | POA: Diagnosis not present

## 2020-01-19 DIAGNOSIS — G309 Alzheimer's disease, unspecified: Secondary | ICD-10-CM | POA: Diagnosis not present

## 2020-01-19 DIAGNOSIS — G629 Polyneuropathy, unspecified: Secondary | ICD-10-CM | POA: Diagnosis not present

## 2020-01-19 DIAGNOSIS — J449 Chronic obstructive pulmonary disease, unspecified: Secondary | ICD-10-CM | POA: Diagnosis not present

## 2020-01-19 DIAGNOSIS — I119 Hypertensive heart disease without heart failure: Secondary | ICD-10-CM | POA: Diagnosis not present

## 2020-01-19 DIAGNOSIS — I2510 Atherosclerotic heart disease of native coronary artery without angina pectoris: Secondary | ICD-10-CM | POA: Diagnosis not present

## 2020-01-19 DIAGNOSIS — M1990 Unspecified osteoarthritis, unspecified site: Secondary | ICD-10-CM | POA: Diagnosis not present

## 2020-01-19 DIAGNOSIS — G894 Chronic pain syndrome: Secondary | ICD-10-CM | POA: Diagnosis not present

## 2020-01-19 DIAGNOSIS — R69 Illness, unspecified: Secondary | ICD-10-CM | POA: Diagnosis not present

## 2020-01-24 DIAGNOSIS — I2510 Atherosclerotic heart disease of native coronary artery without angina pectoris: Secondary | ICD-10-CM | POA: Diagnosis not present

## 2020-01-24 DIAGNOSIS — R69 Illness, unspecified: Secondary | ICD-10-CM | POA: Diagnosis not present

## 2020-01-24 DIAGNOSIS — M1990 Unspecified osteoarthritis, unspecified site: Secondary | ICD-10-CM | POA: Diagnosis not present

## 2020-01-24 DIAGNOSIS — G629 Polyneuropathy, unspecified: Secondary | ICD-10-CM | POA: Diagnosis not present

## 2020-01-24 DIAGNOSIS — G309 Alzheimer's disease, unspecified: Secondary | ICD-10-CM | POA: Diagnosis not present

## 2020-01-24 DIAGNOSIS — G894 Chronic pain syndrome: Secondary | ICD-10-CM | POA: Diagnosis not present

## 2020-01-24 DIAGNOSIS — J449 Chronic obstructive pulmonary disease, unspecified: Secondary | ICD-10-CM | POA: Diagnosis not present

## 2020-01-24 DIAGNOSIS — I119 Hypertensive heart disease without heart failure: Secondary | ICD-10-CM | POA: Diagnosis not present

## 2020-01-25 DIAGNOSIS — G309 Alzheimer's disease, unspecified: Secondary | ICD-10-CM | POA: Diagnosis not present

## 2020-01-25 DIAGNOSIS — I2510 Atherosclerotic heart disease of native coronary artery without angina pectoris: Secondary | ICD-10-CM | POA: Diagnosis not present

## 2020-01-25 DIAGNOSIS — I119 Hypertensive heart disease without heart failure: Secondary | ICD-10-CM | POA: Diagnosis not present

## 2020-01-25 DIAGNOSIS — G629 Polyneuropathy, unspecified: Secondary | ICD-10-CM | POA: Diagnosis not present

## 2020-01-25 DIAGNOSIS — M1990 Unspecified osteoarthritis, unspecified site: Secondary | ICD-10-CM | POA: Diagnosis not present

## 2020-01-25 DIAGNOSIS — G894 Chronic pain syndrome: Secondary | ICD-10-CM | POA: Diagnosis not present

## 2020-01-25 DIAGNOSIS — J449 Chronic obstructive pulmonary disease, unspecified: Secondary | ICD-10-CM | POA: Diagnosis not present

## 2020-01-25 DIAGNOSIS — R69 Illness, unspecified: Secondary | ICD-10-CM | POA: Diagnosis not present

## 2020-01-26 DIAGNOSIS — R69 Illness, unspecified: Secondary | ICD-10-CM | POA: Diagnosis not present

## 2020-01-26 DIAGNOSIS — J449 Chronic obstructive pulmonary disease, unspecified: Secondary | ICD-10-CM | POA: Diagnosis not present

## 2020-01-26 DIAGNOSIS — R54 Age-related physical debility: Secondary | ICD-10-CM | POA: Diagnosis not present

## 2020-01-26 DIAGNOSIS — M1990 Unspecified osteoarthritis, unspecified site: Secondary | ICD-10-CM | POA: Diagnosis not present

## 2020-01-27 DIAGNOSIS — G894 Chronic pain syndrome: Secondary | ICD-10-CM | POA: Diagnosis not present

## 2020-01-27 DIAGNOSIS — J449 Chronic obstructive pulmonary disease, unspecified: Secondary | ICD-10-CM | POA: Diagnosis not present

## 2020-01-27 DIAGNOSIS — M1990 Unspecified osteoarthritis, unspecified site: Secondary | ICD-10-CM | POA: Diagnosis not present

## 2020-01-27 DIAGNOSIS — R69 Illness, unspecified: Secondary | ICD-10-CM | POA: Diagnosis not present

## 2020-01-27 DIAGNOSIS — I2510 Atherosclerotic heart disease of native coronary artery without angina pectoris: Secondary | ICD-10-CM | POA: Diagnosis not present

## 2020-01-27 DIAGNOSIS — G629 Polyneuropathy, unspecified: Secondary | ICD-10-CM | POA: Diagnosis not present

## 2020-01-27 DIAGNOSIS — I119 Hypertensive heart disease without heart failure: Secondary | ICD-10-CM | POA: Diagnosis not present

## 2020-01-27 DIAGNOSIS — G309 Alzheimer's disease, unspecified: Secondary | ICD-10-CM | POA: Diagnosis not present

## 2020-02-01 DIAGNOSIS — J449 Chronic obstructive pulmonary disease, unspecified: Secondary | ICD-10-CM | POA: Diagnosis not present

## 2020-02-01 DIAGNOSIS — G894 Chronic pain syndrome: Secondary | ICD-10-CM | POA: Diagnosis not present

## 2020-02-01 DIAGNOSIS — R69 Illness, unspecified: Secondary | ICD-10-CM | POA: Diagnosis not present

## 2020-02-01 DIAGNOSIS — I119 Hypertensive heart disease without heart failure: Secondary | ICD-10-CM | POA: Diagnosis not present

## 2020-02-01 DIAGNOSIS — I2510 Atherosclerotic heart disease of native coronary artery without angina pectoris: Secondary | ICD-10-CM | POA: Diagnosis not present

## 2020-02-01 DIAGNOSIS — G629 Polyneuropathy, unspecified: Secondary | ICD-10-CM | POA: Diagnosis not present

## 2020-02-01 DIAGNOSIS — M1990 Unspecified osteoarthritis, unspecified site: Secondary | ICD-10-CM | POA: Diagnosis not present

## 2020-02-01 DIAGNOSIS — G309 Alzheimer's disease, unspecified: Secondary | ICD-10-CM | POA: Diagnosis not present

## 2020-02-02 ENCOUNTER — Telehealth: Payer: Self-pay | Admitting: Family Medicine

## 2020-02-02 DIAGNOSIS — G894 Chronic pain syndrome: Secondary | ICD-10-CM | POA: Diagnosis not present

## 2020-02-02 DIAGNOSIS — M1990 Unspecified osteoarthritis, unspecified site: Secondary | ICD-10-CM | POA: Diagnosis not present

## 2020-02-02 DIAGNOSIS — R69 Illness, unspecified: Secondary | ICD-10-CM | POA: Diagnosis not present

## 2020-02-02 DIAGNOSIS — G629 Polyneuropathy, unspecified: Secondary | ICD-10-CM | POA: Diagnosis not present

## 2020-02-02 DIAGNOSIS — I119 Hypertensive heart disease without heart failure: Secondary | ICD-10-CM | POA: Diagnosis not present

## 2020-02-02 DIAGNOSIS — G309 Alzheimer's disease, unspecified: Secondary | ICD-10-CM | POA: Diagnosis not present

## 2020-02-02 DIAGNOSIS — J449 Chronic obstructive pulmonary disease, unspecified: Secondary | ICD-10-CM | POA: Diagnosis not present

## 2020-02-02 DIAGNOSIS — I2510 Atherosclerotic heart disease of native coronary artery without angina pectoris: Secondary | ICD-10-CM | POA: Diagnosis not present

## 2020-02-02 NOTE — Telephone Encounter (Signed)
error 

## 2020-02-05 ENCOUNTER — Other Ambulatory Visit: Payer: Self-pay

## 2020-02-05 ENCOUNTER — Encounter: Payer: Self-pay | Admitting: Nurse Practitioner

## 2020-02-05 ENCOUNTER — Ambulatory Visit (INDEPENDENT_AMBULATORY_CARE_PROVIDER_SITE_OTHER): Payer: Medicare HMO | Admitting: Nurse Practitioner

## 2020-02-05 ENCOUNTER — Telehealth: Payer: Self-pay

## 2020-02-05 VITALS — HR 57 | Temp 96.4°F

## 2020-02-05 DIAGNOSIS — Z23 Encounter for immunization: Secondary | ICD-10-CM | POA: Diagnosis not present

## 2020-02-05 DIAGNOSIS — G894 Chronic pain syndrome: Secondary | ICD-10-CM | POA: Diagnosis not present

## 2020-02-05 DIAGNOSIS — Z0000 Encounter for general adult medical examination without abnormal findings: Secondary | ICD-10-CM

## 2020-02-05 DIAGNOSIS — J449 Chronic obstructive pulmonary disease, unspecified: Secondary | ICD-10-CM | POA: Diagnosis not present

## 2020-02-05 DIAGNOSIS — I2510 Atherosclerotic heart disease of native coronary artery without angina pectoris: Secondary | ICD-10-CM | POA: Diagnosis not present

## 2020-02-05 DIAGNOSIS — R69 Illness, unspecified: Secondary | ICD-10-CM | POA: Diagnosis not present

## 2020-02-05 DIAGNOSIS — M1990 Unspecified osteoarthritis, unspecified site: Secondary | ICD-10-CM | POA: Diagnosis not present

## 2020-02-05 DIAGNOSIS — I119 Hypertensive heart disease without heart failure: Secondary | ICD-10-CM | POA: Diagnosis not present

## 2020-02-05 DIAGNOSIS — G309 Alzheimer's disease, unspecified: Secondary | ICD-10-CM | POA: Diagnosis not present

## 2020-02-05 DIAGNOSIS — G629 Polyneuropathy, unspecified: Secondary | ICD-10-CM | POA: Diagnosis not present

## 2020-02-05 NOTE — Progress Notes (Signed)
   This service is provided via telemedicine  No vital signs collected/recorded due to the encounter was a telemedicine visit.   Location of Chelsea Maddox (ex: home, work):  Home  Chelsea Maddox consents to a telephone visit: Yes, see telephone visit dated 02/05/20  Location of the provider (ex: office, home):  [facility], Office   Name of any referring provider:  N/A  Names of all persons participating in the telemedicine service and their role in the encounter:  S.Chrae B/CMA, Abbey Chatters, NP, [name] (daughter), and Chelsea Maddox   Time spent on call:  11 min with medical assistant

## 2020-02-05 NOTE — Patient Instructions (Signed)
Chelsea Maddox , Thank you for taking time to come for your Medicare Wellness Visit. I appreciate your ongoing commitment to your health goals. Please review the following plan we discussed and let me know if I can assist you in the future.   Screening recommendations/referrals: Colonoscopy aged out Mammogram aged out Bone Density -decline due to dementia  Recommended yearly ophthalmology/optometry visit for glaucoma screening and checkup Recommended yearly dental visit for hygiene and checkup  Vaccinations: Influenza vaccine completed today Pneumococcal vaccine -completed pneumococcal 23  Tdap vaccine - up to date Shingles vaccine  -completed 1   Advanced directives: on file.   Conditions/risks identified: progressive memory loss  Next appointment:  yearly   Preventive Care 30 Years and Older, Female Preventive care refers to lifestyle choices and visits with your health care provider that can promote health and wellness. What does preventive care include?  A yearly physical exam. This is also called an annual well check.  Dental exams once or twice a year.  Routine eye exams. Ask your health care provider how often you should have your eyes checked.  Personal lifestyle choices, including:  Daily care of your teeth and gums.  Regular physical activity.  Eating a healthy diet.  Avoiding tobacco and drug use.  Limiting alcohol use.  Practicing safe sex.  Taking low-dose aspirin every day.  Taking vitamin and mineral supplements as recommended by your health care provider. What happens during an annual well check? The services and screenings done by your health care provider during your annual well check will depend on your age, overall health, lifestyle risk factors, and family history of disease. Counseling  Your health care provider may ask you questions about your:  Alcohol use.  Tobacco use.  Drug use.  Emotional well-being.  Home and relationship  well-being.  Sexual activity.  Eating habits.  History of falls.  Memory and ability to understand (cognition).  Work and work Astronomer.  Reproductive health. Screening  You may have the following tests or measurements:  Height, weight, and BMI.  Blood pressure.  Lipid and cholesterol levels. These may be checked every 5 years, or more frequently if you are over 90 years old.  Skin check.  Lung cancer screening. You may have this screening every year starting at age 19 if you have a 30-pack-year history of smoking and currently smoke or have quit within the past 15 years.  Fecal occult blood test (FOBT) of the stool. You may have this test every year starting at age 12.  Flexible sigmoidoscopy or colonoscopy. You may have a sigmoidoscopy every 5 years or a colonoscopy every 10 years starting at age 32.  Hepatitis C blood test.  Hepatitis B blood test.  Sexually transmitted disease (STD) testing.  Diabetes screening. This is done by checking your blood sugar (glucose) after you have not eaten for a while (fasting). You may have this done every 1-3 years.  Bone density scan. This is done to screen for osteoporosis. You may have this done starting at age 30.  Mammogram. This may be done every 1-2 years. Talk to your health care provider about how often you should have regular mammograms. Talk with your health care provider about your test results, treatment options, and if necessary, the need for more tests. Vaccines  Your health care provider may recommend certain vaccines, such as:  Influenza vaccine. This is recommended every year.  Tetanus, diphtheria, and acellular pertussis (Tdap, Td) vaccine. You may need a Td booster every  10 years.  Zoster vaccine. You may need this after age 76.  Pneumococcal 13-valent conjugate (PCV13) vaccine. One dose is recommended after age 82.  Pneumococcal polysaccharide (PPSV23) vaccine. One dose is recommended after age  87. Talk to your health care provider about which screenings and vaccines you need and how often you need them. This information is not intended to replace advice given to you by your health care provider. Make sure you discuss any questions you have with your health care provider. Document Released: 01/25/2015 Document Revised: 09/18/2015 Document Reviewed: 10/30/2014 Elsevier Interactive Patient Education  2017 [employer] Prevention in the Home Falls can cause injuries. They can happen to people of all ages. There are many things you can do to make your home safe and to help prevent falls. What can I do on the outside of my home?  Regularly fix the edges of walkways and driveways and fix any cracks.  Remove anything that might make you trip as you walk through a door, such as a raised step or threshold.  Trim any bushes or trees on the path to your home.  Use bright outdoor lighting.  Clear any walking paths of anything that might make someone trip, such as rocks or tools.  Regularly check to see if handrails are loose or broken. Make sure that both sides of any steps have handrails.  Any raised decks and porches should have guardrails on the edges.  Have any leaves, snow, or ice cleared regularly.  Use sand or salt on walking paths during winter.  Clean up any spills in your garage right away. This includes oil or grease spills. What can I do in the bathroom?  Use night lights.  Install grab bars by the toilet and in the tub and shower. Do not use towel bars as grab bars.  Use non-skid mats or decals in the tub or shower.  If you need to sit down in the shower, use a plastic, non-slip stool.  Keep the floor dry. Clean up any water that spills on the floor as soon as it happens.  Remove soap buildup in the tub or shower regularly.  Attach bath mats securely with double-sided non-slip rug tape.  Do not have throw rugs and other things on the floor that can make  you trip. What can I do in the bedroom?  Use night lights.  Make sure that you have a light by your bed that is easy to reach.  Do not use any sheets or blankets that are too big for your bed. They should not hang down onto the floor.  Have a firm chair that has side arms. You can use this for support while you get dressed.  Do not have throw rugs and other things on the floor that can make you trip. What can I do in the kitchen?  Clean up any spills right away.  Avoid walking on wet floors.  Keep items that you use a lot in easy-to-reach places.  If you need to reach something above you, use a strong step stool that has a grab bar.  Keep electrical cords out of the way.  Do not use floor polish or wax that makes floors slippery. If you must use wax, use non-skid floor wax.  Do not have throw rugs and other things on the floor that can make you trip. What can I do with my stairs?  Do not leave any items on the stairs.  Make sure  that there are handrails on both sides of the stairs and use them. Fix handrails that are broken or loose. Make sure that handrails are as long as the stairways.  Check any carpeting to make sure that it is firmly attached to the stairs. Fix any carpet that is loose or worn.  Avoid having throw rugs at the top or bottom of the stairs. If you do have throw rugs, attach them to the floor with carpet tape.  Make sure that you have a light switch at the top of the stairs and the bottom of the stairs. If you do not have them, ask someone to add them for you. What else can I do to help prevent falls?  Wear shoes that:  Do not have high heels.  Have rubber bottoms.  Are comfortable and fit you well.  Are closed at the toe. Do not wear sandals.  If you use a stepladder:  Make sure that it is fully opened. Do not climb a closed stepladder.  Make sure that both sides of the stepladder are locked into place.  Ask someone to hold it for you, if  possible.  Clearly mark and make sure that you can see:  Any grab bars or handrails.  First and last steps.  Where the edge of each step is.  Use tools that help you move around (mobility aids) if they are needed. These include:  Canes.  Walkers.  Scooters.  Crutches.  Turn on the lights when you go into a dark area. Replace any light bulbs as soon as they burn out.  Set up your furniture so you have a clear path. Avoid moving your furniture around.  If any of your floors are uneven, fix them.  If there are any pets around you, be aware of where they are.  Review your medicines with your doctor. Some medicines can make you feel dizzy. This can increase your chance of falling. Ask your doctor what other things that you can do to help prevent falls. This information is not intended to replace advice given to you by your health care provider. Make sure you discuss any questions you have with your health care provider. Document Released: 10/25/2008 Document Revised: 06/06/2015 Document Reviewed: 02/02/2014 Elsevier Interactive Patient Education  2017 [employer].

## 2020-02-05 NOTE — Telephone Encounter (Signed)
Ms. Chelsea Maddox, Chelsea Maddox are scheduled for a virtual visit with your provider today.    Just as we do with appointments in the office, we must obtain your consent to participate.  Your consent will be active for this visit and any virtual visit you may have with one of our providers in the next 365 days.    If you have a MyChart account, I can also send a copy of this consent to you electronically.  All virtual visits are billed to your insurance company just like a traditional visit in the office.  As this is a virtual visit, video technology does not allow for your provider to perform a traditional examination.  This may limit your provider's ability to fully assess your condition.  If your provider identifies any concerns that need to be evaluated in person or the need to arrange testing such as labs, EKG, etc, we will make arrangements to do so.    Although advances in technology are sophisticated, we cannot ensure that it will always work on either your end or our end.  If the connection with a video visit is poor, we may have to switch to a telephone visit.  With either a video or telephone visit, we are not always able to ensure that we have a secure connection.   I need to obtain your verbal consent now.   Are you willing to proceed with your visit today?   Chelsea Maddox and spouse has provided verbal consent on 02/05/2020 for a virtual visit (video or telephone).   Chelsea Maddox, [place] 02/05/2020  2:53 PM

## 2020-02-05 NOTE — Progress Notes (Signed)
Subjective:   Chelsea Maddox is a 85 y.o. female who presents for Medicare Annual (Subsequent) preventive examination.  Review of Systems     Cardiac Risk Factors include: advanced age (>71men, >19 women);dyslipidemia     Objective:    Today's Vitals   02/05/20 1534  Pulse: (!) 57  Temp: (!) 96.4 F (35.8 C)  TempSrc: Temporal  SpO2: 99%   There is no height or weight on file to calculate BMI.  Advanced Directives 02/05/2020 09/07/2019 02/01/2019 02/01/2019 09/30/2018 01/10/2018 12/29/2017  Does Patient Have a Medical Advance Directive? Yes Yes Yes Yes Yes No Yes  Type of Estate agent of [place];Living will Healthcare Power of [employer] of [place];Living will Healthcare Power of [place];Living will Healthcare Power of Attorney - -  Does patient want to make changes to medical advance directive? - No - Patient declined No - Patient declined No - Patient declined No - Patient declined - -  Copy of Healthcare Power of Attorney in Chart? Yes - validated most recent copy scanned in chart (See row information) - Yes - validated most recent copy scanned in chart (See row information) Yes - validated most recent copy scanned in chart (See row information) Yes - validated most recent copy scanned in chart (See row information) No - copy requested -    Current Medications (verified) Outpatient Encounter Medications as of 02/05/2020  Medication Sig  . albuterol (PROVENTIL HFA;VENTOLIN HFA) 108 (90 Base) MCG/ACT inhaler Inhale 1-2 puffs into the lungs every 6 (six) hours as needed for wheezing or shortness of breath.  Marland Kitchen aspirin EC 81 MG tablet Take 1 tablet (81 mg total) by mouth daily. Swallow whole.  . Cholecalciferol 125 MCG (5000 UT) capsule Take 5,000 Units by mouth daily.  Marland Kitchen docusate sodium (COLACE) 100 MG capsule Take 100 mg by mouth daily.  . DULoxetine (CYMBALTA) 60 MG capsule Take 1 capsule (60 mg total) by mouth daily.  Marland Kitchen  losartan-hydrochlorothiazide (HYZAAR) 100-12.5 MG tablet Take 1 tablet by mouth daily.  . Melatonin 5 MG CHEW Chew 1 capsule by mouth at bedtime.   Marland Kitchen nystatin (MYCOSTATIN/NYSTOP) powder Apply 1 application topically 3 (three) times daily.  . QUEtiapine (SEROQUEL) 25 MG tablet Take 1 tablet (25 mg total) by mouth at bedtime.  Marland Kitchen UNABLE TO FIND Med Name: CBD Oil, 1/2 drop twice daily  . UNABLE TO FIND Take 2 capsules by mouth daily. Med Name: Bertrum Sol  . UNABLE TO FIND 1,000 mg in the morning and at bedtime. Magnesium L theronate 1,000 units in the morning and 1,000 units in the evening.  . [DISCONTINUED] Misc Natural Products (TART CHERRY ADVANCED PO) Take 3,000 mg by mouth daily.   No facility-administered encounter medications on file as of 02/05/2020.    Allergies (verified) Patient has no known allergies.   History: Past Medical History:  Diagnosis Date  . Allergy   . Arthritis   . Chicken pox   . COPD (chronic obstructive pulmonary disease) (HCC)   . Dementia (HCC)   . Dementia without behavioral disturbance (HCC)    unspecified type  . DJD (degenerative joint disease)   . Generalized headaches   . Hyperlipidemia   . Hypertension   . Hypertensive retinopathy of both eyes   . Kidney disease   . Low back pain   . Macular degeneration   . Major depressive disorder   . Neuropathy   . Nuclear sclerotic cataract of both eyes   . Osteoarthritis   .  Peripheral neuropathy   . Retinal edema   . Thyroid disease   . UTI (urinary tract infection)   . Vertigo   . Vitamin D deficiency    Past Surgical History:  Procedure Laterality Date  . ABDOMINAL HYSTERECTOMY    . COLONOSCOPY  2012  . SACRAL NERVE STIMULATOR PLACEMENT  2018  . TRIGGER FINGER RELEASE     Family History  Problem Relation Age of Onset  . Arthritis Mother   . Diabetes Mother   . Hearing loss Mother   . Hyperlipidemia Mother   . Hypertension Mother   . Aneurysm Mother   . Arthritis Father   . COPD Father    . Hearing loss Father   . Heart disease Father   . Hyperlipidemia Father   . Hypertension Father   . Kidney disease Father   . Anuerysm Father        aortic arch  . Dementia Father   . Arthritis Sister [id number]  . Hypertension Sister   . Macular degeneration Sister   . Aneurysm Sister        aortic  . Arthritis Sister   . Hearing loss Sister   . Hypertension Sister   . Aneurysm Sister        brain  . Early death Brother [id number]  . Hyperlipidemia Brother   . Hypertension Brother   . Aneurysm Brother        aortic  . Arthritis Brother   . Hypertension Brother   . Gout Brother   . Macular degeneration Brother   . Aneurysm Brother        aortic  . Other Brother [id number]       [place]  . Hyperlipidemia Daughter [id number]  . Hypertension Daughter   . Depression Daughter   . Arthritis Daughter   . Arthritis Daughter   . Hypertension Son    Social History   Socioeconomic History  . Marital status: Married    Spouse name: Not on file  . Number of children: 3  . Years of education: Not on file  . Highest education level: Not on file  Occupational History  . Not on file  Tobacco Use  . Smoking status: Never Smoker  . Smokeless tobacco: Never Used  Vaping Use  . Vaping Use: Never used  Substance and Sexual Activity  . Alcohol use: Never  . Drug use: Never  . Sexual activity: Not Currently  Other Topics Concern  . Not on file  Social History Narrative   Social History      Diet?       Do you drink/eat things with caffeine? yes      Marital status?        married                            What year were you married? 1957      Do you live in a house, apartment, assisted living, condo, trailer, etc.? house      Is it one or more stories? 1      How many persons live in your home? 4      Do you have any pets in your home? (please list) 3 dogs, 1 cat      Highest level of education completed? Some college      Current or past profession: homemaker      Do you exercise?  no                               Type & how often?      Advanced Directives      Do you have a living will? yes      Do you have a DNR form?                                  If not, do you want to discuss one? no      Do you have signed POA/HPOA for forms? yes      Functional Status      Do you have difficulty bathing or dressing yourself? yes      Do you have difficulty preparing food or eating?  yes      Do you have difficulty managing your medications? yes      Do you have difficulty managing your finances? yes      Do you have difficulty affording your medications? no   Social Determinants of Health   Financial Resource Strain: Not on file  Food Insecurity: Not on file  Transportation Needs: Not on file  Physical Activity: Not on file  Stress: Not on file  Social Connections: Not on file    Tobacco Counseling Counseling given: Not Answered   Clinical Intake:  Pre-visit preparation completed: Yes  Pain : 0-10 Pain Type: Chronic pain Pain Location: Back (right knee)     BMI - recorded: 26 Nutritional Status: BMI 25 -29 Overweight Nutritional Risks: None Diabetes: No  How often do you need to have someone help you when you read instructions, pamphlets, or other written materials from your doctor or pharmacy?: 5 - Always  Diabetic?no         Activities of Daily Living In your present state of health, do you have any difficulty performing the following activities: 02/05/2020  Hearing? N  Vision? N  Difficulty concentrating or making decisions? Y  Walking or climbing stairs? Y  Dressing or bathing? Y  Doing errands, shopping? Y  Preparing Food and eating ? Y  Using the Toilet? Y  In the past six months, have you accidently leaked urine? Y  Do you have problems with loss of bowel control? Y  Managing your Medications? Y  Managing your Finances? Y  Housekeeping or managing your Housekeeping? Y  Some recent data might be hidden    Patient  Care Team: Sharon Seller, NP as PCP - General (Geriatric Medicine) Judi Saa, DO as Consulting Physician (Family Medicine) Eber Jones, MD as Referring Physician (Ophthalmology)  Indicate any recent Medical Services you may have received from other than [facility] providers in the past year (date may be approximate).     Assessment:   This is a routine wellness examination for Chelsea Maddox.  Hearing/Vision screen  Hearing Screening   125Hz  250Hz  500Hz  1000Hz  2000Hz  3000Hz  4000Hz  6000Hz  8000Hz   Right ear:           Left ear:           Comments: No hearing issues   Vision Screening Comments: No eye exam within the last 12 months. Last eye exam May 2020. Due to diagnosis of dementia/Alzheimer routine eye exam not recommended    Dietary issues and exercise activities discussed: Current Exercise Habits: The patient does not participate in regular exercise  at present  Goals    . Patient Stated     To maintain ability to be mobile.       Depression Screen PHQ 2/9 Scores 02/05/2020 02/01/2019 12/20/2017 11/25/2017  PHQ - 2 Score 0 0 0 0    Fall Risk Fall Risk  02/05/2020 09/07/2019 03/29/2019 02/01/2019 09/30/2018  Falls in the past year? 0 0 0 0 0  Number falls in past yr: 0 0 0 0 0  Injury with Fall? 0 0 0 0 0    FALL RISK PREVENTION PERTAINING TO THE HOME:  Any stairs in or around the home? No  If so, are there any without handrails? No  Home free of loose throw rugs in walkways, pet beds, electrical cords, etc? Yes  Adequate lighting in your home to reduce risk of falls? Yes   ASSISTIVE DEVICES UTILIZED TO PREVENT FALLS:  Life alert? No  Use of a cane, walker or w/c? Yes  Grab bars in the bathroom? Yes  Shower chair or bench in shower? Yes  Elevated toilet seat or a handicapped toilet? Yes   TIMED UP AND GO:  Was the test performed? No .    Cognitive Function: MMSE - Mini Mental State Exam 02/05/2020 12/20/2017  Not completed: Unable to complete -  Orientation to  time - 0  Orientation to Place - 0  Registration - 3  Attention/ Calculation - 3  Recall - 0  Language- name 2 objects - 2  Language- repeat - 0  Language- follow 3 step command - 3  Language- read & follow direction - 1  Write a sentence - 0  Copy design - 0  Total score - 12     6CIT Screen 02/01/2019  What Year? 4 points  What month? 3 points  What time? 3 points  Count back from 20 4 points  Months in reverse 4 points  Repeat phrase 10 points  Total Score 28    Immunizations Immunization History  Administered Date(s) Administered  . Fluad Quad(high Dose 65+) 09/30/2018, 02/05/2020  . Influenza, High Dose Seasonal PF 10/20/2017  . PFIZER(Purple Top)SARS-COV-2 Vaccination 01/26/2019, 02/16/2019  . Pneumococcal Polysaccharide-23 01/02/2019  . Tdap 06/13/2014  . Zoster Recombinat (Shingrix) 01/24/2018    TDAP status: Up to date  FLU VACCINE- recommended   Pneumococcal vaccine status: Up to date  Covid-19 vaccine status: Completed vaccines  Qualifies for Shingles Vaccine? Yes   Zostavax completed No   Shingrix Completed?: No.    Education has been provided regarding the importance of this vaccine. Patient has been advised to call insurance company to determine out of pocket expense if they have not yet received this vaccine. Advised may also receive vaccine at local pharmacy or Health Dept. Verbalized acceptance and understanding.  Screening Tests Health Maintenance  Topic Date Due  . COVID-19 Vaccine (3 - Booster for Pfizer series) 08/16/2019  . PNA vac Low Risk Adult (2 of 2 - PCV13) 01/02/2020  . TETANUS/TDAP  06/12/2024  . INFLUENZA VACCINE  Completed  . DEXA SCAN  Completed    Health Maintenance  Health Maintenance Due  Topic Date Due  . COVID-19 Vaccine (3 - Booster for Pfizer series) 08/16/2019  . PNA vac Low Risk Adult (2 of 2 - PCV13) 01/02/2020    Colorectal cancer screening: No longer required.   Mammogram status: No longer required due to  age.  Bone Density status: Completed 02/21/2018. Results reflect: Bone density results: OSTEOPOROSIS. Repeat every 2 years.  Lung  Cancer Screening: (Low Dose CT Chest recommended if Age 56-80 years, 30 pack-year currently smoking OR have quit w/in 15years.) does not qualify.   Lung Cancer Screening Referral: na  Additional Screening:  Hepatitis C Screening: does not qualify Vision Screening: Recommended annual ophthalmology exams for early detection of glaucoma and other disorders of the eye. Is the patient up to date with their annual eye exam?  No  Who is the provider or what is the name of the office in which the patient attends annual eye exams?  If pt is not established with a provider, would they like to be referred to a provider to establish care? No .   Dental Screening: Recommended annual dental exams for proper oral hygiene  Community Resource Referral / Chronic Care Management: CRR required this visit?  No   CCM required this visit?  No      Plan:     I have personally reviewed and noted the following in the patient's chart:   . Medical and social history . Use of alcohol, tobacco or illicit drugs  . Current medications and supplements . Functional ability and status . Nutritional status . Physical activity . Advanced directives . List of other physicians . Hospitalizations, surgeries, and ER visits in previous 12 months . Vitals . Screenings to include cognitive, depression, and falls . Referrals and appointments  In addition, I have reviewed and discussed with patient certain preventive protocols, quality metrics, and best practice recommendations. A written personalized care plan for preventive services as well as general preventive health recommendations were provided to patient.     Sharon SellerJessica K Arali Somera, NP   02/05/2020    Virtual Visit via Telephone Note  I connected with@ on 02/05/20 at  3:15 PM EST by telephone and verified that I am speaking with the  correct person using two identifiers.  Location:  I discussed the limitations, risks, security and privacy concerns of performing an evaluation and management service by telephone and the availability of in person appointments. I also discussed with the patient that there may be a patient responsible charge related to this service. The patient expressed understanding and agreed to proceed.   I discussed the assessment and treatment plan with the patient. The patient was provided an opportunity to ask questions and all were answered. The patient agreed with the plan and demonstrated an understanding of the instructions.   The patient was advised to call back or seek an in-person evaluation if the symptoms worsen or if the condition fails to improve as anticipated.  I provided 20 minutes of non-face-to-face time during this encounter.  Janene HarveyJessica K. Biagio BorgEubanks, AGNP Avs printed and mailed

## 2020-02-06 DIAGNOSIS — I119 Hypertensive heart disease without heart failure: Secondary | ICD-10-CM | POA: Diagnosis not present

## 2020-02-06 DIAGNOSIS — G894 Chronic pain syndrome: Secondary | ICD-10-CM | POA: Diagnosis not present

## 2020-02-06 DIAGNOSIS — I2510 Atherosclerotic heart disease of native coronary artery without angina pectoris: Secondary | ICD-10-CM | POA: Diagnosis not present

## 2020-02-06 DIAGNOSIS — R69 Illness, unspecified: Secondary | ICD-10-CM | POA: Diagnosis not present

## 2020-02-06 DIAGNOSIS — M1990 Unspecified osteoarthritis, unspecified site: Secondary | ICD-10-CM | POA: Diagnosis not present

## 2020-02-06 DIAGNOSIS — G629 Polyneuropathy, unspecified: Secondary | ICD-10-CM | POA: Diagnosis not present

## 2020-02-06 DIAGNOSIS — J449 Chronic obstructive pulmonary disease, unspecified: Secondary | ICD-10-CM | POA: Diagnosis not present

## 2020-02-06 DIAGNOSIS — G309 Alzheimer's disease, unspecified: Secondary | ICD-10-CM | POA: Diagnosis not present

## 2020-02-26 DIAGNOSIS — J449 Chronic obstructive pulmonary disease, unspecified: Secondary | ICD-10-CM | POA: Diagnosis not present

## 2020-02-26 DIAGNOSIS — M1990 Unspecified osteoarthritis, unspecified site: Secondary | ICD-10-CM | POA: Diagnosis not present

## 2020-02-26 DIAGNOSIS — R69 Illness, unspecified: Secondary | ICD-10-CM | POA: Diagnosis not present

## 2020-02-26 DIAGNOSIS — R54 Age-related physical debility: Secondary | ICD-10-CM | POA: Diagnosis not present

## 2020-03-25 DIAGNOSIS — J449 Chronic obstructive pulmonary disease, unspecified: Secondary | ICD-10-CM | POA: Diagnosis not present

## 2020-03-25 DIAGNOSIS — R69 Illness, unspecified: Secondary | ICD-10-CM | POA: Diagnosis not present

## 2020-03-25 DIAGNOSIS — R54 Age-related physical debility: Secondary | ICD-10-CM | POA: Diagnosis not present

## 2020-03-25 DIAGNOSIS — M1990 Unspecified osteoarthritis, unspecified site: Secondary | ICD-10-CM | POA: Diagnosis not present

## 2020-04-10 ENCOUNTER — Other Ambulatory Visit: Payer: Self-pay | Admitting: Nurse Practitioner

## 2020-04-10 DIAGNOSIS — G8929 Other chronic pain: Secondary | ICD-10-CM

## 2020-04-10 DIAGNOSIS — M545 Low back pain, unspecified: Secondary | ICD-10-CM

## 2020-04-10 DIAGNOSIS — F419 Anxiety disorder, unspecified: Secondary | ICD-10-CM

## 2020-04-25 DIAGNOSIS — R54 Age-related physical debility: Secondary | ICD-10-CM | POA: Diagnosis not present

## 2020-04-25 DIAGNOSIS — M1990 Unspecified osteoarthritis, unspecified site: Secondary | ICD-10-CM | POA: Diagnosis not present

## 2020-04-25 DIAGNOSIS — J449 Chronic obstructive pulmonary disease, unspecified: Secondary | ICD-10-CM | POA: Diagnosis not present

## 2020-04-25 DIAGNOSIS — R69 Illness, unspecified: Secondary | ICD-10-CM | POA: Diagnosis not present

## 2020-05-25 DIAGNOSIS — M1990 Unspecified osteoarthritis, unspecified site: Secondary | ICD-10-CM | POA: Diagnosis not present

## 2020-05-25 DIAGNOSIS — R69 Illness, unspecified: Secondary | ICD-10-CM | POA: Diagnosis not present

## 2020-05-25 DIAGNOSIS — R54 Age-related physical debility: Secondary | ICD-10-CM | POA: Diagnosis not present

## 2020-05-25 DIAGNOSIS — J449 Chronic obstructive pulmonary disease, unspecified: Secondary | ICD-10-CM | POA: Diagnosis not present

## 2020-06-21 IMAGING — CT CT ABD-PELV W/ CM
2 of 5 series · 15 of 46 positions shown, 17 images · IV contrast (APPLIED)
Comparison: None.

CLINICAL DATA: Nausea and vomiting for 2 weeks.

EXAM:
CT ABDOMEN AND PELVIS WITH CONTRAST
TECHNIQUE: Multidetector CT imaging of the abdomen and pelvis was performed
using the standard protocol following bolus administration of
intravenous contrast.
CONTRAST:  100mL MNP11W-TSS IOPAMIDOL (MNP11W-TSS) INJECTION 61%

[Series 2: axial st · axial · 0.89mm/px · z∈[-488,-34]mm · 12 of 103 slices shown, 14 images]
[im 6/103  soft-tissue]
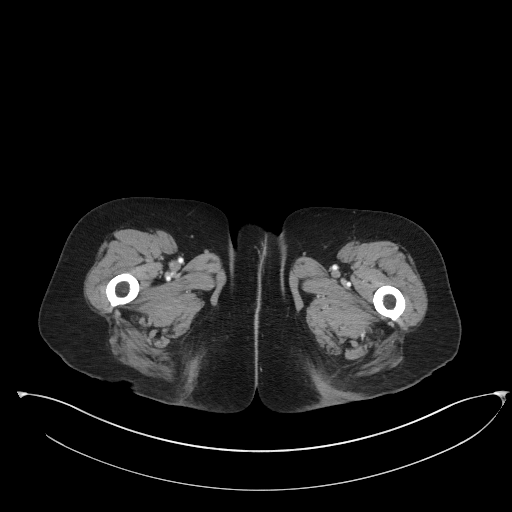
[im 6/103  bone]
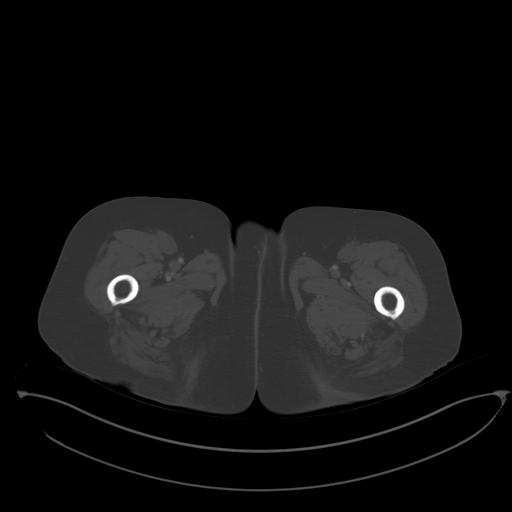
[im 16/103  soft-tissue]
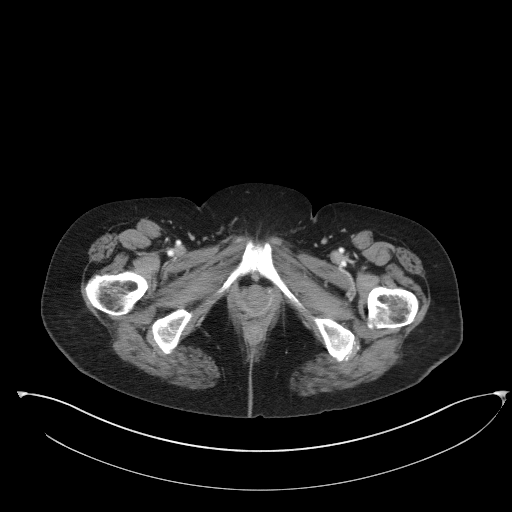
[im 21/103  soft-tissue]
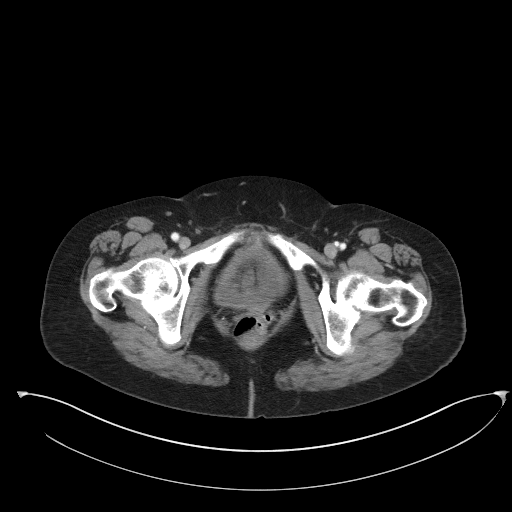
[im 31/103  soft-tissue]
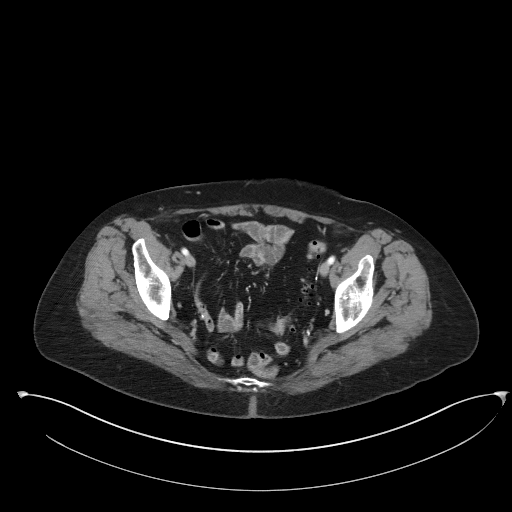
[im 41/103  soft-tissue]
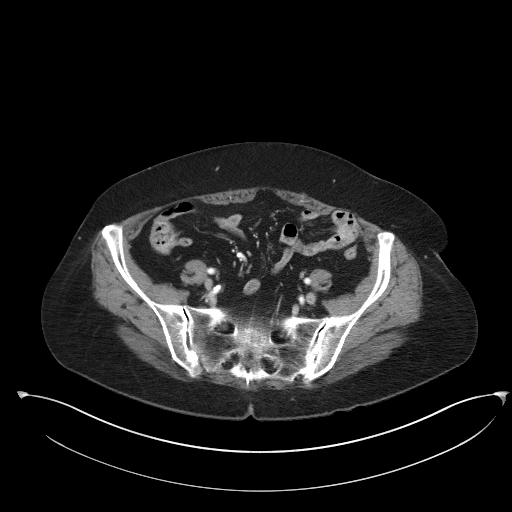
[im 46/103  soft-tissue]
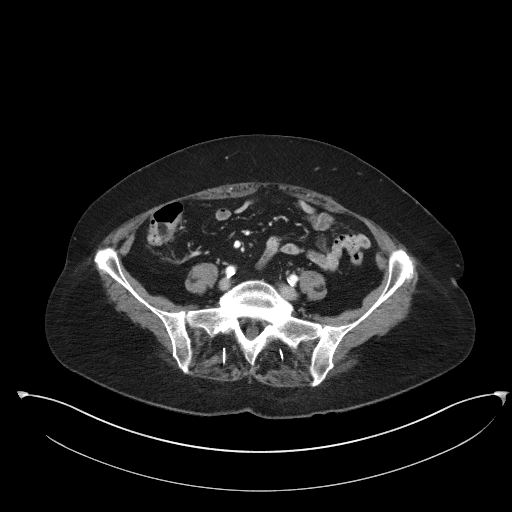
[im 57/103  soft-tissue]
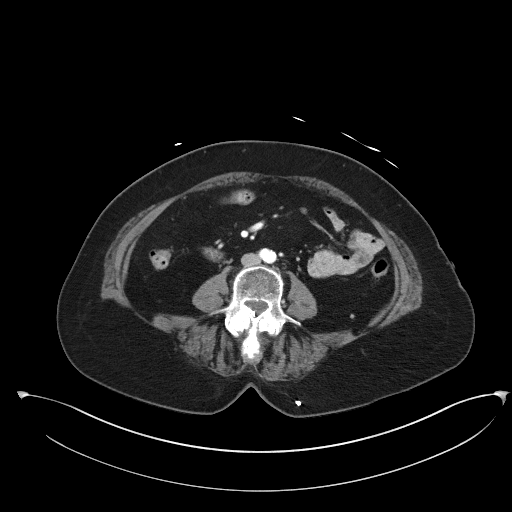
[im 62/103  soft-tissue]
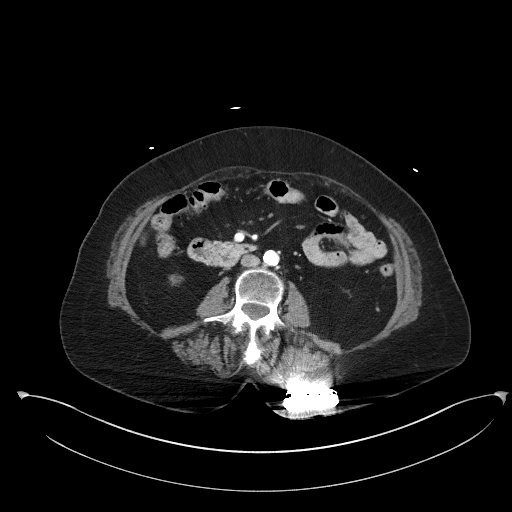
[im 72/103  soft-tissue]
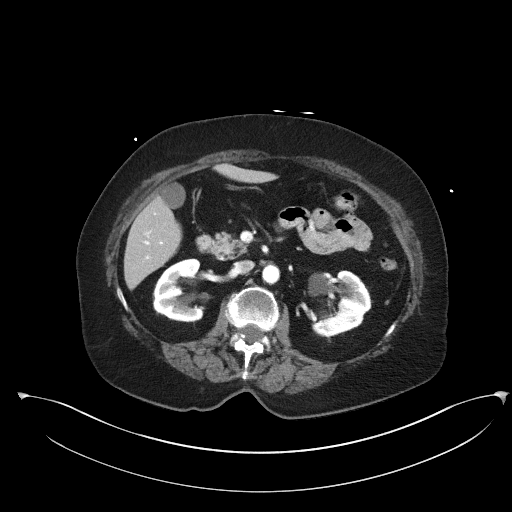
[im 72/103  bone]
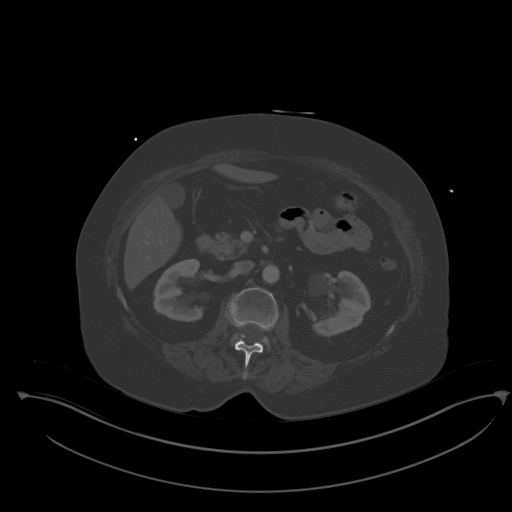
[im 82/103  soft-tissue]
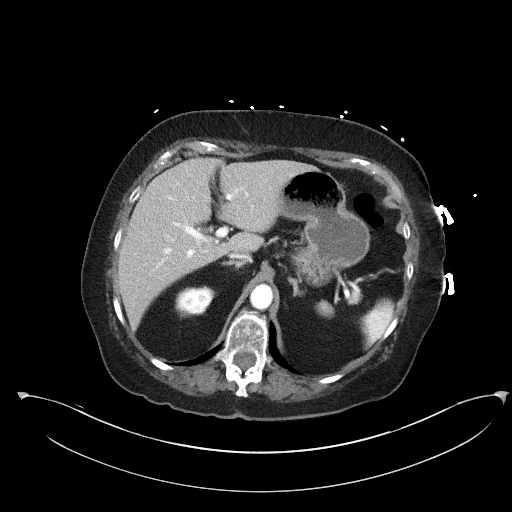
[im 87/103  soft-tissue]
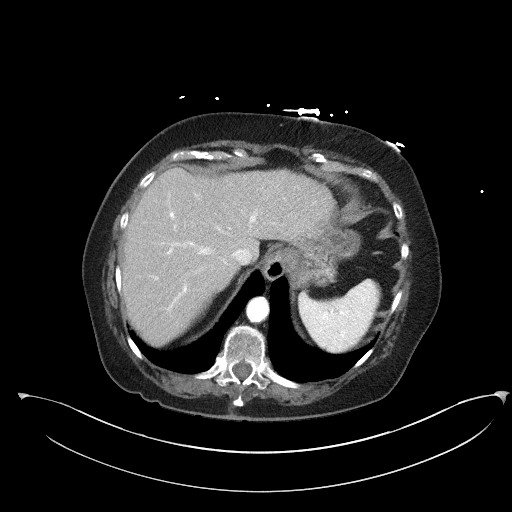
[im 97/103  soft-tissue]
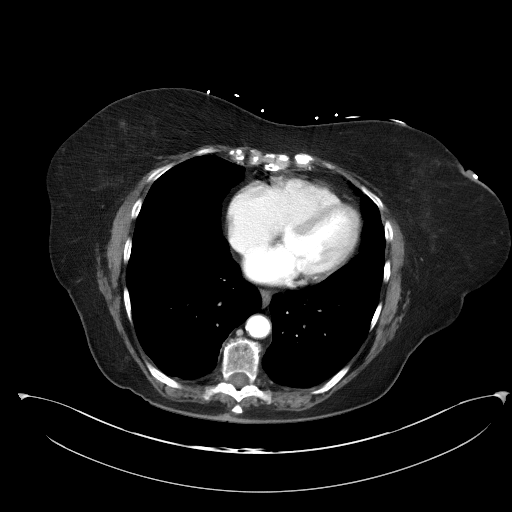

[Series 8: coronal st · coronal · 0.78mm/px · 3 of 94 slices shown]
[im 32/94  soft-tissue]
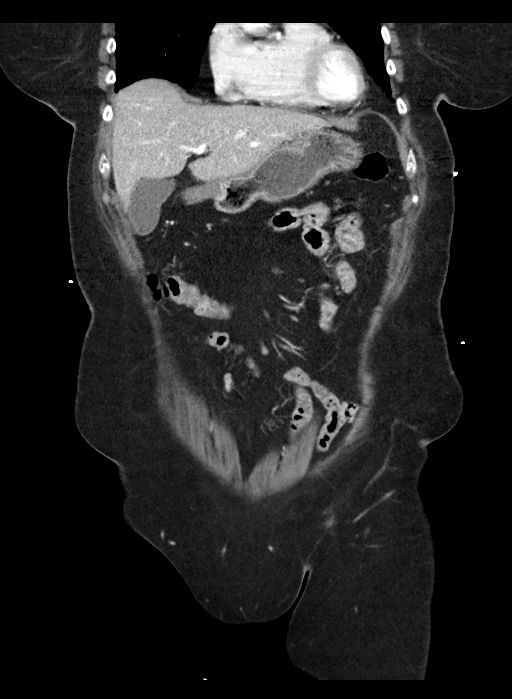
[im 42/94  soft-tissue]
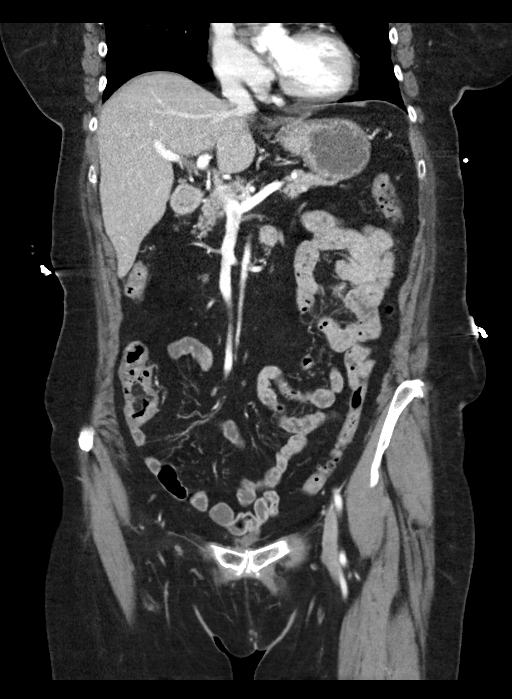
[im 52/94  soft-tissue]
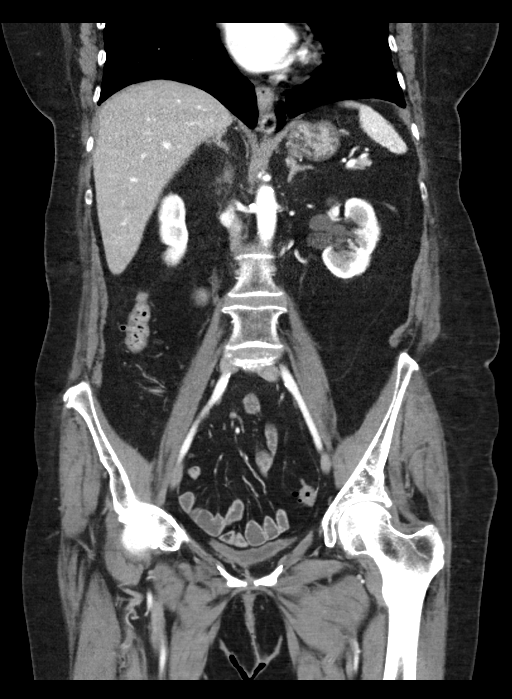

[15 of 46 positions shown; findings below may reference images not displayed]

FINDINGS: Lower chest: Normal heart size with coronary arteriosclerosis. No
pericardial effusion or thickening. Clear lung bases without
effusion or pneumothorax.

Hepatobiliary: Physiologic distention of the gallbladder without
stones. No secondary signs of acute cholecystitis. Steatosis of the
liver without mass.

Pancreas: Normal

Spleen: Normal

Adrenals/Urinary Tract: Normal bilateral adrenal glands. Bilateral
parapelvic renal cysts are noted without renal mass, nephrolithiasis
nor obstructive uropathy. Minimal areas cortical thinning or
scarring is seen in the upper and lower poles of the right kidney
and interpolar left kidney. No hydroureter. The urinary bladder is
unremarkable for the degree of distention.

Stomach/Bowel: Stomach is within normal limits. Appendix appears
normal. No evidence of bowel wall thickening, distention, or
inflammatory changes. There is scattered colonic diverticulosis
without acute diverticulitis.

Vascular/Lymphatic: Moderate aortoiliac atherosclerosis without
aneurysm. No adenopathy.

Reproductive: Status post hysterectomy. No adnexal masses.

Other: Small fat containing periumbilical hernia. No abdominopelvic
ascites.

Musculoskeletal: Neural stimulator device projects over the left
buttock with leads extending into the spinal canal to the L3-4
level. Thoracic spondylosis with multilevel degenerative disc
disease. Lesser degree of degenerative disc disease at L5-S1. Facet
arthrosis is noted of the lumbar spine.
IMPRESSION: 1. Hepatic steatosis.
2. Bilateral parapelvic renal cysts without obstructive uropathy.
3. No acute bowel inflammation or obstruction. Colonic
diverticulosis without acute diverticulitis.

Aortic Atherosclerosis ([id number]).

## 2020-06-25 DIAGNOSIS — J449 Chronic obstructive pulmonary disease, unspecified: Secondary | ICD-10-CM | POA: Diagnosis not present

## 2020-06-25 DIAGNOSIS — M1990 Unspecified osteoarthritis, unspecified site: Secondary | ICD-10-CM | POA: Diagnosis not present

## 2020-06-25 DIAGNOSIS — R54 Age-related physical debility: Secondary | ICD-10-CM | POA: Diagnosis not present

## 2020-06-25 DIAGNOSIS — R69 Illness, unspecified: Secondary | ICD-10-CM | POA: Diagnosis not present

## 2020-07-10 ENCOUNTER — Other Ambulatory Visit: Payer: Medicare HMO | Admitting: *Deleted

## 2020-07-10 NOTE — Patient Outreach (Signed)
[facility]) Care Management  07/10/2020  Juda Lajeunesse 04-24-1935 [id number]  Unsuccessful outreach attempt made to patient's daughter [name] due to patient's dementia to discuss [facility] benefit and services. RN Health Coach left HIPAA compliant voicemail message along with her contact information.  Plan: RN Health Coach will call patient within the next several business days and will send an unsuccessful letter.   Blanchie Serve RN, BSN [facility] Care Management  RN Health Coach [phone] [email or web address]

## 2020-07-12 ENCOUNTER — Other Ambulatory Visit: Payer: Self-pay | Admitting: *Deleted

## 2020-07-12 NOTE — Patient Outreach (Signed)
[facility]) Care Management  07/12/2020  Chelsea Maddox [birth year] [id number]  Unsuccessful outreach attempt made to patient. RN Health Coach left HIPAA compliant voicemail message along with her contact information.  Plan: RN Health Coach will call patient within the next several business days.  Blanchie Serve RN, BSN [facility] Care Management  RN Health Coach [phone] [name]

## 2020-07-16 ENCOUNTER — Other Ambulatory Visit: Payer: Self-pay

## 2020-07-16 ENCOUNTER — Other Ambulatory Visit: Payer: Self-pay | Admitting: Nurse Practitioner

## 2020-07-16 DIAGNOSIS — F419 Anxiety disorder, unspecified: Secondary | ICD-10-CM

## 2020-07-16 DIAGNOSIS — G8929 Other chronic pain: Secondary | ICD-10-CM

## 2020-07-16 MED ORDER — LOSARTAN POTASSIUM-HCTZ 100-12.5 MG PO TABS: 1.0000 | ORAL_TABLET | Freq: Every day | ORAL | 1 refills | Status: AC

## 2020-07-17 MED ORDER — QUETIAPINE FUMARATE 25 MG PO TABS: 25.0000 mg | ORAL_TABLET | Freq: Every day | ORAL | 0 refills | Status: AC

## 2020-07-17 MED ORDER — DULOXETINE HCL 60 MG PO CPEP: 60.0000 mg | ORAL_CAPSULE | Freq: Every day | ORAL | 0 refills | Status: AC

## 2020-07-24 ENCOUNTER — Encounter: Payer: Self-pay | Admitting: Nurse Practitioner

## 2020-07-24 ENCOUNTER — Other Ambulatory Visit: Payer: Self-pay | Admitting: *Deleted

## 2020-07-24 NOTE — Patient Outreach (Addendum)
[facility]) Care Management  07/24/2020  Chelsea Maddox 11/22/1935 [id number]  Unsuccessful outreach attempt #3 made to patient. Unable to leave VM due to voice mailbox is full.  Plan: RN Health Coach will call patient within the month of August.  Blanchie Serve RN, BSN [facility] Care Management  RN Health Coach [phone] [email or web address]

## 2020-07-25 DIAGNOSIS — J449 Chronic obstructive pulmonary disease, unspecified: Secondary | ICD-10-CM | POA: Diagnosis not present

## 2020-07-25 DIAGNOSIS — R69 Illness, unspecified: Secondary | ICD-10-CM | POA: Diagnosis not present

## 2020-07-25 DIAGNOSIS — R54 Age-related physical debility: Secondary | ICD-10-CM | POA: Diagnosis not present

## 2020-07-25 DIAGNOSIS — M1990 Unspecified osteoarthritis, unspecified site: Secondary | ICD-10-CM | POA: Diagnosis not present

## 2020-07-30 ENCOUNTER — Other Ambulatory Visit: Payer: Self-pay | Admitting: *Deleted

## 2020-07-30 ENCOUNTER — Other Ambulatory Visit: Payer: Self-pay | Admitting: Family

## 2020-07-30 ENCOUNTER — Encounter: Payer: Self-pay | Admitting: *Deleted

## 2020-07-30 DIAGNOSIS — G459 Transient cerebral ischemic attack, unspecified: Secondary | ICD-10-CM

## 2020-07-30 DIAGNOSIS — M170 Bilateral primary osteoarthritis of knee: Secondary | ICD-10-CM

## 2020-07-30 DIAGNOSIS — M545 Low back pain, unspecified: Secondary | ICD-10-CM

## 2020-07-30 DIAGNOSIS — R5381 Other malaise: Secondary | ICD-10-CM

## 2020-07-30 DIAGNOSIS — N1831 Chronic kidney disease, stage 3a: Secondary | ICD-10-CM

## 2020-07-30 DIAGNOSIS — G8929 Other chronic pain: Secondary | ICD-10-CM

## 2020-07-30 DIAGNOSIS — F0391 Unspecified dementia with behavioral disturbance: Secondary | ICD-10-CM

## 2020-07-30 NOTE — Patient Outreach (Addendum)
[facility]) Care Management  07/30/2020  Adalis Gatti 17-Nov-1935 [id number]  Successful telephone outreach call to patient's daughter [name] in response to a VM she left for this nurse to call her back. HIPAA identifiers obtained. [name] explained that her mother has end stage Alzheimer's disease and has recently taking a turn for the worse. Daughter has discussed the situation with the patient's PCP and a Hospice referral has been placed on the patient's behalf. Nurse gave the daughter emotional support and discussed that Hospice does provide excellent care for the patient and the family members going through a end of life transition. Daughter was appreciative for the call back.   Plan: Nurse will close the patient's case due to a Hospice referral has been placed for the patient.  Blanchie Serve RN, Mining engineer [facility] [phone] [email or web address]

## 2020-07-30 NOTE — Progress Notes (Signed)
Hospice referral ordered.

## 2020-07-30 NOTE — Telephone Encounter (Signed)
error 

## 2020-08-05 ENCOUNTER — Telehealth: Payer: Self-pay

## 2020-08-05 NOTE — Telephone Encounter (Signed)
Patient's daughter [name]) called to advise Shanda Bumps that patient she currently placed in hospice care. Also [name] cancelled all of the patients appointments that was scheduled with [facility]. [name] is aware that Shanda Bumps is out of the office until tomorrow, 08/06/2020 and still wanted a message to be send to [place].

## 2020-08-06 NOTE — Telephone Encounter (Signed)
Thank you for the update!

## 2020-08-09 ENCOUNTER — Telehealth: Payer: Medicare HMO | Admitting: Nurse Practitioner

## 2020-08-20 ENCOUNTER — Encounter: Payer: Self-pay | Admitting: Nurse Practitioner

## 2020-08-25 DIAGNOSIS — M1990 Unspecified osteoarthritis, unspecified site: Secondary | ICD-10-CM | POA: Diagnosis not present

## 2020-08-25 DIAGNOSIS — J449 Chronic obstructive pulmonary disease, unspecified: Secondary | ICD-10-CM | POA: Diagnosis not present

## 2020-08-25 DIAGNOSIS — R54 Age-related physical debility: Secondary | ICD-10-CM | POA: Diagnosis not present

## 2020-08-25 DIAGNOSIS — R69 Illness, unspecified: Secondary | ICD-10-CM | POA: Diagnosis not present

## 2020-09-11 ENCOUNTER — Ambulatory Visit: Payer: Self-pay | Admitting: *Deleted

## 2020-09-12 DEATH — deceased

## 2021-02-05 ENCOUNTER — Encounter: Payer: Medicare HMO | Admitting: Nurse Practitioner
